# Patient Record
Sex: Male | Born: 1965 | Race: White | Hispanic: No | Marital: Married | Smoking: Never smoker
Health system: Southern US, Community
[De-identification: ages and names within clinical notes are randomized; demographics above are authoritative.]

## PROBLEM LIST (undated history)

## (undated) DIAGNOSIS — G4730 Sleep apnea, unspecified: Secondary | ICD-10-CM

## (undated) DIAGNOSIS — F32A Depression, unspecified: Secondary | ICD-10-CM

## (undated) DIAGNOSIS — I10 Essential (primary) hypertension: Secondary | ICD-10-CM

## (undated) DIAGNOSIS — E785 Hyperlipidemia, unspecified: Secondary | ICD-10-CM

## (undated) DIAGNOSIS — F909 Attention-deficit hyperactivity disorder, unspecified type: Secondary | ICD-10-CM

## (undated) DIAGNOSIS — F419 Anxiety disorder, unspecified: Secondary | ICD-10-CM

## (undated) HISTORY — DX: Attention-deficit hyperactivity disorder, unspecified type: F90.9

## (undated) HISTORY — DX: Depression, unspecified: F32.A

## (undated) HISTORY — DX: Sleep apnea, unspecified: G47.30

## (undated) HISTORY — DX: Hyperlipidemia, unspecified: E78.5

## (undated) HISTORY — DX: Essential (primary) hypertension: I10

## (undated) HISTORY — DX: Anxiety disorder, unspecified: F41.9

## (undated) HISTORY — PX: COLONOSCOPY W/ POLYPECTOMY: SHX1380

---

## 2004-09-15 ENCOUNTER — Ambulatory Visit: Payer: Self-pay | Admitting: Family Medicine

## 2005-04-09 ENCOUNTER — Ambulatory Visit: Payer: Self-pay | Admitting: Family Medicine

## 2005-04-22 ENCOUNTER — Ambulatory Visit: Payer: Self-pay | Admitting: Family Medicine

## 2008-04-26 ENCOUNTER — Emergency Department (HOSPITAL_COMMUNITY): Admission: EM | Admit: 2008-04-26 | Discharge: 2008-04-27 | Payer: Self-pay | Admitting: Emergency Medicine

## 2010-07-20 LAB — PROTIME-INR: Prothrombin Time: 14.3 seconds (ref 11.6–15.2)

## 2010-07-20 LAB — CBC
HCT: 45.9 % (ref 39.0–52.0)
Hemoglobin: 15.4 g/dL (ref 13.0–17.0)
MCV: 88.5 fL (ref 78.0–100.0)
WBC: 9.4 10*3/uL (ref 4.0–10.5)

## 2010-07-20 LAB — COMPREHENSIVE METABOLIC PANEL
Alkaline Phosphatase: 42 U/L (ref 39–117)
BUN: 25 mg/dL — ABNORMAL HIGH (ref 6–23)
CO2: 25 mEq/L (ref 19–32)
Chloride: 105 mEq/L (ref 96–112)
GFR calc non Af Amer: 35 mL/min — ABNORMAL LOW (ref >60)
Glucose, Bld: 88 mg/dL (ref 70–99)
Potassium: 4.5 mEq/L (ref 3.5–5.1)
Total Bilirubin: 1.1 mg/dL (ref 0.3–1.2)

## 2010-07-20 LAB — URINE MICROSCOPIC-ADD ON

## 2010-07-20 LAB — DIFFERENTIAL
Basophils Absolute: 0 10*3/uL (ref 0.0–0.1)
Basophils Relative: 1 % (ref 0–1)
Monocytes Absolute: 0.7 10*3/uL (ref 0.1–1.0)
Neutro Abs: 7.1 10*3/uL (ref 1.7–7.7)
Neutrophils Relative %: 75 % (ref 43–77)

## 2010-07-20 LAB — URINALYSIS, ROUTINE W REFLEX MICROSCOPIC
Bilirubin Urine: NEGATIVE
Leukocytes, UA: NEGATIVE
Nitrite: NEGATIVE
Specific Gravity, Urine: 1.014 (ref 1.005–1.030)
pH: 6 (ref 5.0–8.0)

## 2010-07-20 LAB — APTT: aPTT: 30 seconds (ref 24–37)

## 2010-07-20 LAB — POCT CARDIAC MARKERS
Troponin i, poc: <0.05 ng/mL (ref 0.00–0.09)
Troponin i, poc: <0.05 ng/mL (ref 0.00–0.09)

## 2020-11-19 ENCOUNTER — Encounter: Payer: Self-pay | Admitting: Neurology

## 2020-11-19 ENCOUNTER — Other Ambulatory Visit: Payer: Self-pay | Admitting: Neurology

## 2020-11-20 ENCOUNTER — Ambulatory Visit (INDEPENDENT_AMBULATORY_CARE_PROVIDER_SITE_OTHER): Payer: BC Managed Care – PPO | Admitting: Neurology

## 2020-11-20 ENCOUNTER — Encounter: Payer: Self-pay | Admitting: Neurology

## 2020-11-20 VITALS — BP 127/75 | HR 52 | Ht 71.0 in | Wt 233.0 lb

## 2020-11-20 DIAGNOSIS — F5112 Insufficient sleep syndrome: Secondary | ICD-10-CM | POA: Diagnosis not present

## 2020-11-20 DIAGNOSIS — G4719 Other hypersomnia: Secondary | ICD-10-CM

## 2020-11-20 DIAGNOSIS — K08109 Complete loss of teeth, unspecified cause, unspecified class: Secondary | ICD-10-CM | POA: Insufficient documentation

## 2020-11-20 DIAGNOSIS — G4733 Obstructive sleep apnea (adult) (pediatric): Secondary | ICD-10-CM | POA: Insufficient documentation

## 2020-11-20 DIAGNOSIS — R351 Nocturia: Secondary | ICD-10-CM | POA: Diagnosis not present

## 2020-11-20 NOTE — Progress Notes (Signed)
Kyle Carter    SLEEP MEDICINE CLINIC    Provider:  Melvyn Novas, MD   Primary Care Physician:  Nonnie Done., MD [address] [place] [address]     Referring Provider: Nonnie Done., Md [address] [place],  [place] [address]          Chief Complaint according to patient   Patient presents with:     New Patient (Initial Visit)           HISTORY OF PRESENT ILLNESS:  Kyle Carter is a 55 y.o. year old White or Caucasian male patient seen here as a referral on 11/20/2020 from PCP for a sleep consultation:. he patient has been using CPAPfor over almost decade, last sleep study in 09-2017, at Dr. Winfred Leeds , [facility].   and last new CPAP issued in the last year 2021.  The patient's wife reports him snoring through the CPAP.   The patient lives in [place] and he has had issues with snoring and residual apnea on his current CPAP.  Again the CPAP is a rather new model.  And I have some compliance download here 1 over the last 90 days which shows a 78% compliance.  The compliance has become more spotty as he is less happy with his CPAP use.  He is actually using at BiPAP with an incoming pressure of 18 and a expiratory pressure setting of 14 cmH2O is easy breeze function on.  The residual AHI is 26.6/h so what this basically means is that the machine is not working for him.  He has a severe residual apnea by using it.  Most of the events still seem to be obstructive in nature but there are also some central apneas arising.  The air leak at median is 16.2 L and that should not be the cause of the high residual apnea.  Minute ventilation and tidal volume have been in the same range for the last 90 days respiratory rate is not wearing very much. The patient presented with excessive daytime sleepiness reflected and an Epworth sleepiness score of 18 points. There was a report of a dry mouth in the morning witnessed lo ud snoring and witnessed apneas by his  spouse. The patient's regular sleep routine is as follows; the patient normally goes to bed around 11 and falls asleep within 10-15 minutes he is awoken 3 times at night and nocturia and is woken by his alarm clock at 6 in the morning. 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  This functional set up he also has increased daytime sleepiness and currently endorsed the Epworth sleepiness score at 17 points.  He uses a dream wear FFM, and has facial hair.  He has never used any other mask that was not a FFM, he has slept with his mouth wide open, dry , may ned a chin strap.        I have the pleasure of seeing Kyle Carter today, a right-handed White or Caucasian male with OSA untreated on BiPAP  sleep disorder who  has a past medical history of ADHD, Anxiety, Depression, Hyperlipidemia, Hypertension, and Sleep apnea.     Sleep relevant medical history: Nocturia- 4-5 ,  no cervical spine surgery,  deviated septum with left sided restricted airflow.     Family medical /sleep history: one brother on CPAP with OSA.    Social history:  Patient is working as Biomedical engineer-  lives with spouse and step son.many pets.  The patient currently works/ used to work in shifts(early,)  Tobacco use/.  ETOH use 2/ night ,  Caffeine intake in form of Tea ( 2 a day) or energy drinks. Regular exercise in form of physical work .      Sleep habits are as follows: The patient's dinner time is between 6-7 PM. The patient goes to bed at 8.30 PM and continues to sleep for 4-5 hours, wakes for many, many bathroom breaks.   The preferred sleep position is supine , with the support of 2 pillows.  Dreams are reportedly rare.  3.30  AM is the usual rise time. The patient wakes up with an alarm. He snoozes his alarm, is often late for work.  He reports not feeling refreshed or restored in AM, with symptoms such as dry mouth, morning headaches, and residual fatigue.  Naps are taken frequently, dozes off, Its enough  to sit still for 2-3 minutes.    Review of Systems: Out of a complete 14 system review, the patient complains of only the following symptoms, and all other reviewed systems are negative.:  Fatigue, sleepiness , snoring, fragmented sleep, NOCTURIA, untreated, insufficient Sleep apnea treatment,    How likely are you to doze in the following situations: 0 = not likely, 1 = slight chance, 2 = moderate chance, 3 = high chance   Sitting and Reading? Watching Television? Sitting inactive in a public place (theater or meeting)? As a passenger in a car for an hour without a break? Lying down in the afternoon when circumstances permit? Sitting and talking to someone? Sitting quietly after lunch without alcohol? In a car, while stopped for a few minutes in traffic?   Total = 18/ 24 points   FSS endorsed at 29/ 63 points.   Social History   Socioeconomic History   Marital status: Married    Spouse name: Not on file   Number of children: Not on file   Years of education: Not on file   Highest education level: Not on file  Occupational History   Not on file  Tobacco Use   Smoking status: Never   Smokeless tobacco: Never  Substance and Sexual Activity   Alcohol use: Yes    Comment: occasional   Drug use: Not Currently   Sexual activity: Not on file  Other Topics Concern   Not on file  Social History Narrative   Not on file   Social Determinants of Health   Financial Resource Strain: Not on file  Food Insecurity: Not on file  Transportation Needs: Not on file  Physical Activity: Not on file  Stress: Not on file  Social Connections: Not on file    Family History  Problem Relation Age of Onset   Diabetes Mother    Liver cancer Mother    Diabetes Father     Past Medical History:  Diagnosis Date   ADHD    Anxiety    Depression    Hyperlipidemia    Hypertension    Sleep apnea     Current Outpatient Medications on File Prior to Visit  Medication Sig Dispense Refill    amphetamine-dextroamphetamine (ADDERALL XR) 30 MG 24 hr capsule      atenolol (TENORMIN) 100 MG tablet      atorvastatin (LIPITOR) 10 MG tablet Take 10 mg by mouth daily.     busPIRone (BUSPAR) 10 MG tablet Take 10 mg by mouth 2 (two) times daily.  FLUoxetine (PROZAC) 40 MG capsule Take 40 mg by mouth 2 (two) times daily.     zolpidem (AMBIEN) 10 MG tablet Take 10 mg by mouth at bedtime.     No current facility-administered medications on file prior to visit.    Not on File  Physical exam:  Today's Vitals   11/20/20 1114  BP: 127/75  Pulse: (!) 52  Weight: 233 lb (105.7 kg)  Height: 5\' 11"  (1.803 m)   Body mass index is 32.5 kg/m.   Wt Readings from Last 3 Encounters:  11/20/20 233 lb (105.7 kg)     Ht Readings from Last 3 Encounters:  11/20/20 5\' 11"  (1.803 m)      General: The patient is awake, alert and appears not in acute distress. The patient is well groomed. Head: Normocephalic, atraumatic. Neck is supple. Mallampati 3,  neck circumference:18 inches . Nasal airflow barely- deviated, left side barely patent.  Retrognathia is not seen.  Dental status: edentulous Cardiovascular:  Regular rate and cardiac rhythm by pulse,  without distended neck veins. Respiratory: Lungs are clear to auscultation.  Skin:  Without evidence of ankle edema, or rash. Trunk: The patient's posture is erect.   Neurologic exam : The patient is awake and alert, oriented to place and time.   Memory subjective described as intact.  Attention span & concentration ability appears normal.  Speech is fluent,  without  dysarthria, dysphonia or aphasia.  Mood and affect are appropriate.   Cranial nerves: no loss of smell or taste reported  Pupils are equal and briskly reactive to light. Funduscopic exam deferred. .  Extraocular movements in vertical and horizontal planes were intact and without nystagmus. No Diplopia. Visual fields by finger perimetry are intact. Hearing was intact to soft  voice and finger rubbing.    Facial sensation intact to fine touch.  Facial motor strength is symmetric and tongue and uvula move midline.  Neck ROM : rotation, tilt and flexion extension were normal for age and shoulder shrug was symmetrical.    Motor exam:  Symmetric bulk, tone and ROM.   Normal tone without cog -wheeling, symmetric grip strength .   Sensory:  vibration were normal.  Proprioception tested in the upper extremities was normal.   Coordination: Rapid alternating movements in the fingers/hands were of normal speed.  The Finger-to-nose maneuver was intact without evidence of ataxia, dysmetria or tremor.   Gait and station: Patient could rise unassisted from a seated position, walked without assistive device.  Stance is of normal width/ base and the patient turned with 3 steps.  Toe and heel walk were deferred.  Deep tendon reflexes: in the  upper and lower extremities are symmetric and intact.  Babinski response was deferred .       After spending a total time of  45  minutes face to face and additional time for physical and neurologic examination, review of laboratory studies,  personal review of imaging studies, reports and results of other testing and review of referral information / records as far as provided in visit, I have established the following assessments:  Kyle Carter reports that he has been tested and refurbished with a CPAP first at Dr. office in [place].  Recently his machine was replaced without a new baseline study his last sleep study he stated was in June 2019 and there have not been any following.  Since his old CPAP is now replaced by a BiPAP he has noted some significant changes in his  sleep pattern  First overall he does not feel refreshed and restored anymore he wakes up with a very dry mouth and his Epworth score is very high excessive daytime sleepiness is present.  #2 his residual apnea-hypopnea index is so high that it is almost  untreated apnea.  AHI was 29.1 on BiPAP and she does not recall having the same difficulty sleeping when he was on CPAP.  Part of this may also be the air leak related to facial hair and using a F 30 I or DreamWear full facemask.  There is definitely a lesser seal but I do not think that his leakage is the sole explanation for his residual apneas.  They are obstructive in origin and that he seems to need higher pressures to overcome those.  So what I would like to address is to offer the patient a chinstrap especially since he is now edentulous and has less structure to support his mouth opening.  #2 I would very much prefer to have a new sleep study on him this is meant to see if he has actually central apneas and not just obstructive and apneas and what degree his current apnea is now that he has no teeth yet.   The CPAP titration from Dr. Rachael Darby was dated 09-11-2017 and he did not have significant oxygen desaturation during titration had bradycardia but no rhythm abnormalities, snoring was described as illuminated with nasal CPAP.  No leg movements were noted.  And he was titrated beginning at 5 cmH2O CPAP to a final pressure of BiPAP 18/14 in which he slept 90 out of 158 minutes.    The apnea-hypopnea index was 0.  I will asked the patient to come in for a split night titration in the meantime to hopefully bridge over I will reset his current sleep apnea machine to 20/16 cmH2O I believe that he has not enough pressure to overcome his apnea at this time but it may not be sufficient yet to eliminate all residual apnea.   My Plan is to proceed with:  1) I need a baseline and a PAP titration, currently not doing well on 18/ 14 cm water.  2) I reset to 10/ 16 cm water  3) I will order an elastic chin strap   I would like to thank Egbert Garibaldi, Excell Seltzer., MD and Nonnie Done., Md [address] [place],  [place] [address] for allowing me to meet with and to take care of this pleasant patient.   In short, Kyle Carter is presenting with insufficiently treated OSA ,I plan to follow up either personally or through our NP within 2-4  month.   CC: I will share my notes with PCP.he patient has been using CPAP  Electronically signed by: Melvyn Novas, MD 11/20/2020 11:34 AM  [name] Board certified by The [employer] of Sleep Medicine and Diplomate of the [employer] of Sleep Medicine. Board certified In Neurology through the ABPN, Fellow of the [employer] of Neurology. Medical Director of [facility].

## 2021-01-17 ENCOUNTER — Emergency Department (HOSPITAL_COMMUNITY): Payer: BC Managed Care – PPO

## 2021-01-17 ENCOUNTER — Other Ambulatory Visit: Payer: Self-pay

## 2021-01-17 ENCOUNTER — Encounter (HOSPITAL_COMMUNITY): Payer: Self-pay | Admitting: Emergency Medicine

## 2021-01-17 ENCOUNTER — Emergency Department (HOSPITAL_COMMUNITY)
Admission: EM | Admit: 2021-01-17 | Discharge: 2021-01-17 | Disposition: A | Discharge status: Home / Self Care | Payer: BC Managed Care – PPO | Attending: Emergency Medicine | Admitting: Emergency Medicine

## 2021-01-17 DIAGNOSIS — M545 Low back pain, unspecified: Secondary | ICD-10-CM | POA: Diagnosis not present

## 2021-01-17 DIAGNOSIS — I10 Essential (primary) hypertension: Secondary | ICD-10-CM | POA: Diagnosis not present

## 2021-01-17 DIAGNOSIS — N3001 Acute cystitis with hematuria: Secondary | ICD-10-CM | POA: Diagnosis not present

## 2021-01-17 DIAGNOSIS — R309 Painful micturition, unspecified: Secondary | ICD-10-CM | POA: Diagnosis present

## 2021-01-17 LAB — URINALYSIS, ROUTINE W REFLEX MICROSCOPIC
Bilirubin Urine: NEGATIVE
Glucose, UA: NEGATIVE mg/dL
Ketones, ur: NEGATIVE mg/dL
Nitrite: NEGATIVE
Protein, ur: NEGATIVE mg/dL
Specific Gravity, Urine: 1.02 (ref 1.005–1.030)
pH: 5 (ref 5.0–8.0)

## 2021-01-17 LAB — CBC
HCT: 41.8 % (ref 39.0–52.0)
Hemoglobin: 14.4 g/dL (ref 13.0–17.0)
MCH: 29.3 pg (ref 26.0–34.0)
MCHC: 34.4 g/dL (ref 30.0–36.0)
MCV: 85 fL (ref 80.0–100.0)
Platelets: 164 10*3/uL (ref 150–400)
RBC: 4.92 MIL/uL (ref 4.22–5.81)
RDW: 12.8 % (ref 11.5–15.5)
WBC: 6.2 10*3/uL (ref 4.0–10.5)
nRBC: 0 % (ref 0.0–0.2)

## 2021-01-17 LAB — COMPREHENSIVE METABOLIC PANEL
ALT: 18 U/L (ref 0–44)
AST: 13 U/L — ABNORMAL LOW (ref 15–41)
Albumin: 3.7 g/dL (ref 3.5–5.0)
Alkaline Phosphatase: 45 U/L (ref 38–126)
Anion gap: 9 (ref 5–15)
BUN: 29 mg/dL — ABNORMAL HIGH (ref 6–20)
CO2: 21 mmol/L — ABNORMAL LOW (ref 22–32)
Calcium: 8.3 mg/dL — ABNORMAL LOW (ref 8.9–10.3)
Chloride: 104 mmol/L (ref 98–111)
Creatinine, Ser: 1.05 mg/dL (ref 0.61–1.24)
GFR, Estimated: >60 mL/min (ref >60)
Glucose, Bld: 116 mg/dL — ABNORMAL HIGH (ref 70–99)
Potassium: 3.8 mmol/L (ref 3.5–5.1)
Sodium: 134 mmol/L — ABNORMAL LOW (ref 135–145)
Total Bilirubin: 1.7 mg/dL — ABNORMAL HIGH (ref 0.3–1.2)
Total Protein: 7 g/dL (ref 6.5–8.1)

## 2021-01-17 MED ORDER — SODIUM CHLORIDE 0.9 % IV SOLN
2.0000 g | Freq: Once | INTRAVENOUS | Status: AC
  Administered 2021-01-17: 2 g via INTRAVENOUS
  Filled 2021-01-17: qty 20

## 2021-01-17 MED ORDER — CEFDINIR 300 MG PO CAPS: 300.0000 mg | ORAL_CAPSULE | Freq: Two times a day (BID) | ORAL | 0 refills | Status: AC

## 2021-01-17 MED ORDER — SODIUM CHLORIDE 0.9 % IV BOLUS
1000.0000 mL | Freq: Once | INTRAVENOUS | Status: AC
  Administered 2021-01-17: 1000 mL via INTRAVENOUS

## 2021-01-17 NOTE — ED Triage Notes (Signed)
Pt was dx'd with a UTI 2 days ago and given levaquin. States that he had a fever of 103 at home tonight. Denies N/V. No hematuria.

## 2021-01-17 NOTE — Discharge Instructions (Addendum)
You were evaluated in the Emergency Department and after careful evaluation, we did not find any emergent condition requiring admission or further testing in the hospital.  Your exam/testing today was overall reassuring.  Please stop taking the Levaquin and start taking the [facility] antibiotic.  Please return to the Emergency Department if you experience any worsening of your condition.  Thank you for allowing [place] to be a part of your care.

## 2021-01-17 NOTE — ED Notes (Signed)
Patient transported to CT 

## 2021-01-17 NOTE — ED Provider Notes (Signed)
[facility]-EMERGENCY DEPT [facility] Emergency Department Provider Note MRN:  [id number]  Arrival date & time: 01/17/21     Chief Complaint   Fever   History of Present Illness   Kyle Carter is a 55 y.o. year-old male with no pertinent past medical history presenting to the ED with chief complaint of fever.  Patient has been having burning with urination and blood in the urine for the past 2 or 3 days, was seen by PCP and started on Levaquin.  Has taken 2 doses, once daily as directed.  Last dose yesterday at 5 PM.  Over the evening had a fever up to 102 at home.  Continued mild burning with urination.  Denies any significant abdominal pain.  Endorsing some mild mid low back pain.  No chest pain or shortness of breath, no fever, cough, cold-like symptoms.  No rash.  No other complaints.  Symptoms mild to moderate, constant, no exacerbating or alleviating factors.  Review of Systems  A complete 10 system review of systems was obtained and all systems are negative except as noted in the HPI and PMH.   Patient's Health History    Past Medical History:  Diagnosis Date   ADHD    Anxiety    Depression    Hyperlipidemia    Hypertension    Sleep apnea     Past Surgical History:  Procedure Laterality Date   COLONOSCOPY W/ POLYPECTOMY      Family History  Problem Relation Age of Onset   Diabetes Mother    Liver cancer Mother    Diabetes Father     Social History   Socioeconomic History   Marital status: Married    Spouse name: Not on file   Number of children: Not on file   Years of education: Not on file   Highest education level: Not on file  Occupational History   Not on file  Tobacco Use   Smoking status: Never   Smokeless tobacco: Never  Substance and Sexual Activity   Alcohol use: Yes    Comment: occasional   Drug use: Not Currently   Sexual activity: Not on file  Other Topics Concern   Not on file  Social History Narrative   Not on file   Social  Determinants of Health   Financial Resource Strain: Not on file  Food Insecurity: Not on file  Transportation Needs: Not on file  Physical Activity: Not on file  Stress: Not on file  Social Connections: Not on file  Intimate Partner Violence: Not on file     Physical Exam   Vitals:   01/17/21 0521 01/17/21 0620  BP: 112/72 117/60  Pulse: (!) 52 (!) 56  Resp: 17 18  Temp:    SpO2: 97% 98%    CONSTITUTIONAL: Well-appearing, NAD NEURO:  Alert and oriented x 3, no focal deficits EYES:  eyes equal and reactive ENT/NECK:  no LAD, no JVD CARDIO: Regular rate, well-perfused, normal S1 and S2 PULM:  CTAB no wheezing or rhonchi GI/GU:  normal bowel sounds, non-distended, non-tender MSK/SPINE:  No gross deformities, no edema SKIN:  no rash, atraumatic PSYCH:  Appropriate speech and behavior  *Additional and/or pertinent findings included in MDM below  Diagnostic and Interventional Summary    EKG Interpretation  Date/Time:    Ventricular Rate:    PR Interval:    QRS Duration:   QT Interval:    QTC Calculation:   R Axis:     Text Interpretation:  Labs Reviewed  URINALYSIS, ROUTINE W REFLEX MICROSCOPIC - Abnormal; Notable for the following components:      Result Value   Color, Urine AMBER (*)    Hgb urine dipstick SMALL (*)    Leukocytes,Ua TRACE (*)    Bacteria, UA RARE (*)    All other components within normal limits  COMPREHENSIVE METABOLIC PANEL - Abnormal; Notable for the following components:   Sodium 134 (*)    CO2 21 (*)    Glucose, Bld 116 (*)    BUN 29 (*)    Calcium 8.3 (*)    AST 13 (*)    Total Bilirubin 1.7 (*)    All other components within normal limits  URINE CULTURE  CBC    CT RENAL STONE STUDY  Final Result      Medications  cefTRIAXone (ROCEPHIN) 2 g in sodium chloride 0.9 % 100 mL IVPB (0 g Intravenous Stopped 01/17/21 0636)  sodium chloride 0.9 % bolus 1,000 mL (0 mLs Intravenous Stopped 01/17/21 0636)     Procedures  /   Critical Care Procedures  ED Course and Medical Decision Making  I have reviewed the triage vital signs, the nursing notes, and pertinent available records from the EMR.  Listed above are laboratory and imaging tests that I personally ordered, reviewed, and interpreted and then considered in my medical decision making (see below for details).  Suspect fever due to urinary tract infection not responding to Levaquin.  Vital signs reassuring here, nontoxic, benign abdomen.  Obtaining labs, providing IV fluids, ceftriaxone, will obtain CT scan to exclude signs of abscess or kidney stone that would explain failure antibiotics.  Continues to do well and work-up is reassuring, would be a candidate for discharge on a different antibiotic.     Work-up reassuring, appropriate for discharge.  Elmer Sow. Pilar Plate, MD [facility] Emergency Medicine [facility] [email or web address]  Final Clinical Impressions(s) / ED Diagnoses     ICD-10-CM   1. Acute cystitis with hematuria  N30.01       ED Discharge Orders          Ordered    cefdinir (OMNICEF) 300 MG capsule  2 times daily        01/17/21 0708             Discharge Instructions Discussed with and Provided to Patient:     Discharge Instructions      You were evaluated in the Emergency Department and after careful evaluation, we did not find any emergent condition requiring admission or further testing in the hospital.  Your exam/testing today was overall reassuring.  Please stop taking the Levaquin and start taking the [facility] antibiotic.  Please return to the Emergency Department if you experience any worsening of your condition.  Thank you for allowing [place] to be a part of your care.         Sabas Sous, MD 01/17/21 [phone]

## 2021-01-17 NOTE — ED Notes (Addendum)
, °

## 2021-01-17 NOTE — ED Notes (Signed)
Pt returned from CT °

## 2021-01-18 LAB — URINE CULTURE
Culture: NO GROWTH
Special Requests: NORMAL

## 2021-01-21 ENCOUNTER — Other Ambulatory Visit: Payer: Self-pay

## 2021-01-21 ENCOUNTER — Ambulatory Visit (INDEPENDENT_AMBULATORY_CARE_PROVIDER_SITE_OTHER): Payer: BC Managed Care – PPO | Admitting: Neurology

## 2021-01-21 DIAGNOSIS — R351 Nocturia: Secondary | ICD-10-CM

## 2021-01-21 DIAGNOSIS — G4733 Obstructive sleep apnea (adult) (pediatric): Secondary | ICD-10-CM

## 2021-01-21 DIAGNOSIS — G4719 Other hypersomnia: Secondary | ICD-10-CM

## 2021-01-21 DIAGNOSIS — F5112 Insufficient sleep syndrome: Secondary | ICD-10-CM

## 2021-01-21 DIAGNOSIS — K08109 Complete loss of teeth, unspecified cause, unspecified class: Secondary | ICD-10-CM

## 2021-01-27 NOTE — Addendum Note (Signed)
Addended by: Melvyn Novas on: 01/27/2021 06:15 PM   Modules accepted: Orders

## 2021-01-27 NOTE — Procedures (Signed)
PATIENT'S NAME:  Dacotah, Cabello DOB:      1966/02/26      MR#:    [id number]     DATE OF RECORDING: 01/21/2021 REFERRING M.D.:  Cheri Rous, MD Study Performed:   Titration to positive airway pressure  HISTORY:  RYANE CANAVAN , is a right-handed male with know OSA sleep disorder on BiPAP, who is edentulous an  has a medical history of ADHD, Anxiety, Depression, Hyperlipidemia, Hypertension. Sleep apnea was last evaluated by Dr. Eugenie Birks in [place], 09-2017, but his machine dates from 2021. He reports Nocturia, 4-5 times a night. EDS- severe sleepiness> He works for [employer], early shift schedule.   The patient has had issues with snoring and residual apnea on his current BiPAP.  Again, the PAP machine is a rather new model.  And I have some compliance download data; over the last 90 days which shows a 78% compliance.  The compliance has become more spotty as he is less happy with his BiPAP use.  He is actually using at BiPAP with an incoming pressure of 18 and  expiratory pressure setting of 14 cmH2O with " easy breeze function" on. The residual AHI is 26.6/h so what this basically means is that the machine is not working for him.  He has a severe residual apnea by using it.   Most of the events still seem to be obstructive in nature but there are also some central apneas.  The air leak at median is 16.2 L and that should not be the cause of the high residual apnea.   Minute ventilation and tidal volume have been in the same range for the last 90 days.   The patient endorsed the Epworth Sleepiness Scale at 18/24 points and the Fatigue Score 29 points.   The patient's weight 233 pounds with a height of 71 (inches), resulting in a BMI of 32.7 kg/m2. The patient's neck circumference measured 18 inches.  CURRENT MEDICATIONS: Adderall XR, Tenormin, Lipitor, Buspar, Prozac, Ambien    PROCEDURE:  This is a multichannel digital polysomnogram utilizing the SomnoStar 11.2 system.  Electrodes and sensors  were applied and monitored per AASM Specifications.   EEG, EOG, Chin and Limb EMG, were sampled at 200 Hz.  ECG, Snore and Nasal Pressure, Thermal Airflow, Respiratory Effort, CPAP Flow and Pressure, Oximetry was sampled at 50 Hz. Digital video and audio were recorded.       Simplus FFM in size small/medium PAP was initiated at 10/6 cmH20 BiPAP and with heated humidity per AASM standards this pressure was advanced to 25 /21cmH20 because of hypopneas, apneas and desaturations. CPAP was not tried. 4 cm pressure spread was maintained all night.  No improvement of sleep apnea was noted, the patient had severe air-leaks and used a chin strap, will need to use dentures to support oral structure and mask fit (tried another mask, EVORA, which failed to seal better)   Lights Out was at 21:49 and Lights On at 05:07. Total recording time (TRT) was 437 minutes, with a total sleep time (TST) of 387 minutes.  The patient's sleep latency was 6.5 minutes.  REM latency was 36.5 minutes. The sleep efficiency was 88.6 %.    SLEEP ARCHITECTURE: WASO (Wake after sleep onset) was 43.5 minutes.  There were 61 minutes in Stage N1, 203.5 minutes Stage N2, 4.5 minutes Stage N3 and 118 minutes in Stage REM.  The percentage of Stage N1 was 15.8%, Stage N2 was 52.6%, Stage N3 was 1.2% and Stage  R (REM sleep) was 30.5%. The sleep architecture was notable for severe fragmentation, early REM sleep onset.  RESPIRATORY ANALYSIS:  There was a total of 190 respiratory events: 40 obstructive apneas, 0 central apneas and 0 mixed apneas with a total of 40 apneas and an apnea index (AI) of 6.2 /hour. There were 150 hypopneas with a hypopnea index of 23.3/hour.  The total APNEA/HYPOPNEA INDEX  (AHI) was 29.5 /hour and the total RESPIRATORY DISTURBANCE INDEX was 29.5 /hour  78 events occurred in REM sleep and 112 events in NREM. The REM AHI was 39.7 /hour versus a non-REM AHI of 25. /hour.   The patient slept all 387 minutes of total sleep  time in the supine position.  OXYGEN SATURATION & C02:  The baseline 02 saturation was 97%, with the lowest being 74%. Time spent below 89% saturation equaled 98 minutes. PERIODIC LIMB MOVEMENTS:  The patient had a total of 44 Periodic Limb Movements. The Periodic Limb Movement (PLM Arousal index was 0 /hour. The arousals were noted as: 59 were spontaneous, 0 were associated with PLMs, 77 were associated with respiratory events. The patient took bathroom breaks. Snoring was noted. EKG was in keeping with normal sinus rhythm (NSR). The patient was fitted with a Simplus FFM mask, and a chin strap was used.  DIAGNOSIS Moderate -severe Obstructive Sleep Apnea, not responding to BiPAP at various pressures, always using a spread of 4 cm water. Even the highest setting at 25/21 cm water BiPAP did leave the patient with an AHI of 22.9/h, best result was seen a setting of 21/17 cm BiPAP over 65 minutes with an AHI of 11.9/h.   Extremely fragmented sleep.  Sleep Related Hypoxemia, frequent and brief desaturations,    PLANS/RECOMMENDATIONS: The patient was fitted with a Simplus FFM mask, BiPAP will be used at 21/16 cm water  The patient was on BIPAP before and felt he couldn't tolerate the device. One problem is the high air leakage- he needs dentures to support facial structure under the mask. Chin strap alone did not help. His BiPAP should be reset in the waiting time for this test to 21/16 cm water. Once dentures are present, will retry BiPAP with various spread ( 4 , 5 , 6 cm difference Inspiratory versus Expiratory pressure.          DISCUSSION: A follow up appointment with BiPAP and recent downloads will be scheduled in the Sleep Clinic at [facility].   Please arrange for dental visit first -  call [phone] with any questions.      I certify that I have reviewed the entire raw data recording prior to the issuance of this report in accordance with the Standards of  Accreditation of the American Academy of Sleep Medicine (AASM)      Melvyn Novas, M.D. Diplomat, Biomedical engineer of Psychiatry and Neurology  Diplomat, Biomedical engineer of Sleep Medicine Wellsite geologist, [employer] [employer]

## 2021-01-27 NOTE — Progress Notes (Signed)
All night BiPAP titration with 4 cm spread.  Edentulous patient . All night supine sleep.  No resolution of apnea found, only high air leak in spite of FFM and chin strap. Hypoxia was noted, nadir at 74%.  Did best under 21/17 cm water, with AHI 11.9. and nearly all residual events were hypopneas.   The patient was fitted with a Simplus FFM mask, BiPAP will be used at 21/16 cm water  The patient was on BIPAP before and felt he couldn't tolerate the device. One problem is the high air leakage- he needs dentures to support facial structure under the mask. Chin strap alone did not help. His BiPAP should be reset in the waiting time for this test to 21/16 cm water. Once dentures are present, will retry CPAP/ and if needed BiPAP with various spread ( 4 , 5 , 6 cm difference Inspiratory versus Expiratory pressure).

## 2021-01-29 ENCOUNTER — Telehealth: Payer: Self-pay | Admitting: Neurology

## 2021-01-29 ENCOUNTER — Other Ambulatory Visit: Payer: Self-pay | Admitting: Neurology

## 2021-01-29 DIAGNOSIS — G4733 Obstructive sleep apnea (adult) (pediatric): Secondary | ICD-10-CM

## 2021-01-29 DIAGNOSIS — G4719 Other hypersomnia: Secondary | ICD-10-CM

## 2021-01-29 NOTE — Telephone Encounter (Signed)
-----   Message from Melvyn Novas, MD sent at 01/27/2021  6:15 PM EDT ----- All night BiPAP titration with 4 cm spread.  Edentulous patient . All night supine sleep.  No resolution of apnea found, only high air leak in spite of FFM and chin strap. Hypoxia was noted, nadir at 74%.  Did best under 21/17 cm water, with AHI 11.9. and nearly all residual events were hypopneas.   The patient was fitted with a Simplus FFM mask, BiPAP will be used at 21/16 cm water  The patient was on BIPAP before and felt he couldn't tolerate the device. One problem is the high air leakage- he needs dentures to support facial structure under the mask. Chin strap alone did not help. His BiPAP should be reset in the waiting time for this test to 21/16 cm water. Once dentures are present, will retry CPAP/ and if needed BiPAP with various spread ( 4 , 5 , 6 cm difference Inspiratory versus Expiratory pressure).

## 2021-01-29 NOTE — Telephone Encounter (Signed)
Called the patient to review his sleep study results.  The patient was started on a pressure was BiPAP and throughout the night made adjustments.  At a pressure of 21/17 centimeters of water pressure that is where his apnea was best treated.  Dr. Vickey Huger recommends adjusting the pressure to that range and making sure the patient is scheduled for the mask that was used in the sleep lab.  Advised the patient the order being sent to [facility] for them to make that adjustment.  Patient verbalized understanding.  Patient was advised to also schedule an appointment and to call if there is any issues or concerns in the meantime.

## 2021-02-05 NOTE — Telephone Encounter (Signed)
Pt called, need [employer] to file a claim for mask for CPAP machine. When claim is denied, we will to file a appeal. Have already discussed with [employer]. Would like a call back  Best number to contact me.[phone]

## 2021-02-11 ENCOUNTER — Other Ambulatory Visit: Payer: Self-pay | Admitting: Neurology

## 2021-02-11 DIAGNOSIS — G4719 Other hypersomnia: Secondary | ICD-10-CM

## 2021-02-11 DIAGNOSIS — F5112 Insufficient sleep syndrome: Secondary | ICD-10-CM

## 2021-02-11 DIAGNOSIS — G4733 Obstructive sleep apnea (adult) (pediatric): Secondary | ICD-10-CM

## 2021-02-11 NOTE — Telephone Encounter (Signed)
Pt's wife, Jermall Isaacson (on [place]) called, mask he has from previous does not work. Leaking air and gasping for air, not breathing and have to wake up. Sleep study he was fitted with a different mask. [facility] cannot get a mask until December and head gear until March 2023. Need PA done for a mask and head gear. If denied we will do a medical necessity appeal. Would like a call from the nurse

## 2021-02-11 NOTE — Telephone Encounter (Signed)
Our office does not have anything to do with insurance claims related to mask, supplies, CPAP/BiPAP.  This is all done by [facility] or the DME company.  Called [facility] and was on the phone on hold for 25 minutes and once I finally got a representative and explained everything he advised that they had everything from their end and that nothing else was needed from [place].  He advised he would have someone from their center contact the patient. After hanging up with [facility] I contacted the patient's wife.  Advised that our office has nothing to do with CPAP supplies, insurance related to the machine and she verbalized understanding that information.  She works for [employer] and states that usually if something is needing to be overrided, a authorization is needed. I advised that this would still come from the DME company.  Alternatively I advised that we could transfer him to a different DME company as long as we have everything needed.  She agreed to this plan. [facility] is who I will try and transfer the pt to

## 2021-10-12 ENCOUNTER — Other Ambulatory Visit: Payer: Self-pay

## 2021-10-12 ENCOUNTER — Emergency Department (EMERGENCY_DEPARTMENT_HOSPITAL)
Admission: EM | Admit: 2021-10-12 | Discharge: 2021-10-13 | Disposition: A | Discharge status: Other Acute Inpatient Hospital | Payer: BC Managed Care – PPO | Source: Home / Self Care | Attending: Emergency Medicine | Admitting: Emergency Medicine

## 2021-10-12 ENCOUNTER — Encounter (HOSPITAL_COMMUNITY): Payer: Self-pay

## 2021-10-12 DIAGNOSIS — Z9189 Other specified personal risk factors, not elsewhere classified: Secondary | ICD-10-CM

## 2021-10-12 DIAGNOSIS — Z79899 Other long term (current) drug therapy: Secondary | ICD-10-CM | POA: Insufficient documentation

## 2021-10-12 DIAGNOSIS — R4589 Other symptoms and signs involving emotional state: Secondary | ICD-10-CM

## 2021-10-12 DIAGNOSIS — F332 Major depressive disorder, recurrent severe without psychotic features: Secondary | ICD-10-CM

## 2021-10-12 DIAGNOSIS — Z20822 Contact with and (suspected) exposure to covid-19: Secondary | ICD-10-CM | POA: Insufficient documentation

## 2021-10-12 DIAGNOSIS — F411 Generalized anxiety disorder: Secondary | ICD-10-CM | POA: Insufficient documentation

## 2021-10-12 DIAGNOSIS — R45851 Suicidal ideations: Secondary | ICD-10-CM

## 2021-10-12 LAB — COMPREHENSIVE METABOLIC PANEL
ALT: 24 U/L (ref 0–44)
AST: 20 U/L (ref 15–41)
Albumin: 4.1 g/dL (ref 3.5–5.0)
Alkaline Phosphatase: 41 U/L (ref 38–126)
Anion gap: 11 (ref 5–15)
BUN: 12 mg/dL (ref 6–20)
CO2: 21 mmol/L — ABNORMAL LOW (ref 22–32)
Calcium: 9.1 mg/dL (ref 8.9–10.3)
Chloride: 106 mmol/L (ref 98–111)
Creatinine, Ser: 1 mg/dL (ref 0.61–1.24)
GFR, Estimated: >60 mL/min (ref >60)
Glucose, Bld: 99 mg/dL (ref 70–99)
Potassium: 3.9 mmol/L (ref 3.5–5.1)
Sodium: 138 mmol/L (ref 135–145)
Total Bilirubin: 1.3 mg/dL — ABNORMAL HIGH (ref 0.3–1.2)
Total Protein: 6.9 g/dL (ref 6.5–8.1)

## 2021-10-12 LAB — CBC
HCT: 45.5 % (ref 39.0–52.0)
Hemoglobin: 15.1 g/dL (ref 13.0–17.0)
MCH: 29.3 pg (ref 26.0–34.0)
MCHC: 33.2 g/dL (ref 30.0–36.0)
MCV: 88.2 fL (ref 80.0–100.0)
Platelets: 240 10*3/uL (ref 150–400)
RBC: 5.16 MIL/uL (ref 4.22–5.81)
RDW: 12.9 % (ref 11.5–15.5)
WBC: 6.6 10*3/uL (ref 4.0–10.5)
nRBC: 0 % (ref 0.0–0.2)

## 2021-10-12 LAB — RAPID URINE DRUG SCREEN, HOSP PERFORMED
Amphetamines: NOT DETECTED
Barbiturates: NOT DETECTED
Benzodiazepines: NOT DETECTED
Cocaine: NOT DETECTED
Opiates: NOT DETECTED
Tetrahydrocannabinol: NOT DETECTED

## 2021-10-12 LAB — ACETAMINOPHEN LEVEL: Acetaminophen (Tylenol), Serum: <10 ug/mL — ABNORMAL LOW (ref 10–30)

## 2021-10-12 LAB — SALICYLATE LEVEL: Salicylate Lvl: <7 mg/dL — ABNORMAL LOW (ref 7.0–30.0)

## 2021-10-12 LAB — ETHANOL: Alcohol, Ethyl (B): <10 mg/dL (ref <10)

## 2021-10-12 NOTE — ED Notes (Signed)
TTS in process 

## 2021-10-12 NOTE — ED Provider Triage Note (Signed)
Emergency Medicine Provider Triage Evaluation Note  Kyle Carter , a 56 y.o. male  was evaluated in triage.  Pt complains of suicidal ideation. He reports that recently he has been having a hard time wife.  He states he has done things that he is not proud of.  They were arguing throughout the day and he got to the point where he picked with the intent that she will himself.  He states that he thought about due to his wife, he did not shoot himself.  He denies any thoughts about harming his wife.  He denies AVH.  He states that he does drink about 3 days a week and if he has more than a few drinks he has to get irritable.  He denies any substance use. He denies any history of self-harm.  He reports that he strained relationship with his two children noting they didn't wish him happy fathers day.   HE REPORTS HE HAS ACCESS TO MULTIPLE GUNS IN THE HOME.    Physical Exam  BP 137/83 (BP Location: Right Arm)   Pulse (!) 53   Temp 98.8 F (37.1 C) (Oral)   Resp 15   Ht 5\' 11"  (1.803 m)   Wt 108 kg   SpO2 97%   BMI 33.19 kg/m  Gen:   Awake, no distress   Resp:  Normal effort  MSK:   Moves extremities without difficulty  Other:  Patient is tearful, flat affect.  Medical Decision Making  Medically screening exam initiated at 8:26 PM.  Appropriate orders placed.  Dobbs was informed that the remainder of the evaluation will be completed by another provider, this initial triage assessment does not replace that evaluation, and the importance of remaining in the ED until their evaluation is complete.  Patient is currently here voluntarily.  He admits that he needs help stating he did not realize things were this bad. Given that he has access to firearms and went to the point of picking 1 up to attempt to kill himself today if he attempts to leave I would strongly recommend IVC.   Barbera Setters, Cristina Gong 10/12/21 2028
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
## 2021-10-12 NOTE — ED Notes (Signed)
Pt placed into burgandy scrubs.

## 2021-10-12 NOTE — ED Triage Notes (Signed)
Pt reports he has having marital problems and grabbed one of his guns today and thought about shooting himself. He changed his mind when he thought about his wife. Denies HI/AVH/drug/alcohol abuse.

## 2021-10-12 NOTE — ED Provider Notes (Signed)
[facility] EMERGENCY DEPARTMENT Provider Note   CSN: [id number] Arrival date & time: 10/12/21  8841     History  Chief Complaint  Patient presents with   Suicidal    Kyle Carter is a 57 y.o. male who presents today for evaluation of suicidal nation.  He reports that he has had issues with his wife recently.  They have been arguing.  Today things reached a boiling point where he picked up a gun and states he was going to shoot him self.  When I ask him what stopped him he tells me he though about what it would do to his wife.  He states he has done things he isn't proud of.  He reports that he has MULTIPLE GUNS IN THE HOME AND THAT BOTH HE AND HIS WIFE OWN GUNS.  He denies history of SI or HI.  He has no prior psychiatric admissions per his report.    He denies substance use, does have alcohol about three times a week.    He reports stressors including that his kids didn't wish him happy fathers day.  He denies any ingestions today.    He denies any physical complaints or concerns.    He reports that his wife called the police and he agreed to get help.   He is currently voluntary.   HPI     Home Medications Prior to Admission medications   Medication Sig Start Date End Date Taking? Authorizing Provider  amLODipine (NORVASC) 5 MG tablet Take 5 mg by mouth daily. 12/03/20   [provider]  amphetamine-dextroamphetamine (ADDERALL) 30 MG tablet Take 30 tablets by mouth 2 (two) times daily. 12/26/20   [provider]  atenolol (TENORMIN) 100 MG tablet Take 100 mg by mouth daily.    [provider]  atorvastatin (LIPITOR) 10 MG tablet Take 10 mg by mouth daily.    [provider]  busPIRone (BUSPAR) 10 MG tablet Take 10 mg by mouth 2 (two) times daily.    [provider]  FLUoxetine (PROZAC) 40 MG capsule Take 40 mg by mouth daily.    [provider]  levofloxacin (LEVAQUIN) 500 MG tablet Take 500 mg by  mouth daily. For 10 days 01/15/21   [provider]  zolpidem (AMBIEN) 10 MG tablet Take 10 mg by mouth at bedtime as needed for sleep.    [provider]      Allergies    Patient has no known allergies.     Physical Exam Updated Vital Signs BP 137/83 (BP Location: Right Arm)   Pulse (!) 53   Temp 98.8 F (37.1 C) (Oral)   Resp 15   Ht 5\' 11"  (1.803 m)   Wt 108 kg   SpO2 97%   BMI 33.19 kg/m  Physical Exam Vitals and nursing note reviewed.  Constitutional:      General: He is not in acute distress.    Appearance: He is not diaphoretic.  HENT:     Head: Normocephalic and atraumatic.  Eyes:     General: No scleral icterus.       Right eye: No discharge.        Left eye: No discharge.     Conjunctiva/sclera: Conjunctivae normal.  Cardiovascular:     Rate and Rhythm: Normal rate and regular rhythm.  Pulmonary:     Effort: Pulmonary effort is normal. No respiratory distress.     Breath sounds: No stridor.  Abdominal:  General: There is no distension.  Musculoskeletal:        General: No deformity.     Cervical back: Normal range of motion.  Skin:    General: Skin is warm and dry.  Neurological:     General: No focal deficit present.     Mental Status: He is alert.     Motor: No abnormal muscle tone.  Psychiatric:        Attention and Perception: Attention normal.        Mood and Affect: Mood is depressed. Affect is blunt and tearful.        Speech: Speech normal.        Behavior: Behavior is cooperative.        Thought Content: Thought content includes suicidal ideation. Thought content does not include homicidal ideation. Thought content includes suicidal plan. Thought content does not include homicidal plan.        Judgment: Judgment is impulsive.     Comments: Doesn't appear to respond to internal stimuli.      ED Results / Procedures / Treatments   Labs (all labs ordered are listed, but only abnormal results are displayed) Labs  Reviewed  COMPREHENSIVE METABOLIC PANEL - Abnormal; Notable for the following components:      Result Value   CO2 21 (*)    Total Bilirubin 1.3 (*)    All other components within normal limits  SALICYLATE LEVEL - Abnormal; Notable for the following components:   Salicylate Lvl <7.0 (*)    All other components within normal limits  ACETAMINOPHEN LEVEL - Abnormal; Notable for the following components:   Acetaminophen (Tylenol), Serum <10 (*)    All other components within normal limits  RESP PANEL BY RT-PCR (FLU A&B, COVID) ARPGX2  ETHANOL  CBC  RAPID URINE DRUG SCREEN, HOSP PERFORMED    EKG None  Radiology No results found.  Procedures Procedures    Medications Ordered in ED Medications - No data to display  ED Course/ Medical Decision Making/ A&P                           Medical Decision Making Amount and/or Complexity of Data Reviewed Labs: ordered.   This patient presents to the ED for concern of suicidal gesture.  He had picked up a gun with a plan to shoot himself today.  He has access to multiple firearms in the home including pistols and shotguns per his report. I attempted to review outside records and previous notes, I saw that he has a history of OSA treated with BiPAP. No significant psychiatric history noted other than reports of anxiety and depression and ADHD. Currently patient is voluntary. Given that he has access to firearms and went as far as picking him up today to attempt to himself with marital conflicts I am concerned that he is a very high risk for suicide attempt with adequate means.  He is currently voluntary admits that he needs help.   Labs are obtained and reviewed. Acetaminophen and salicylate levels are undetected.  Ethanol is undetected.  CBC and CMP are unremarkable his T. bili is minimally elevated, however this appears to be improved from his prior I do not think this is clinically significant.  UDS is negative.  Baseline EKG is  ordered.  COVID test is ordered per protocol.  At this time patient is medically clear for psychiatric evaluation and disposition. While he is currently voluntary if he does  attempt to leave he would require IVC.  I did discuss this with the patient who states his understanding and confirms that he wants to be here to get help.       Final Clinical Impression(s) / ED Diagnoses Final diagnoses:  Suicidal behavior without attempted self-injury  Has access to firearm    Rx / DC Orders ED Discharge Orders     None         Norman Clay 10/12/21 2128    Gerhard Munch, MD 10/12/21 2309

## 2021-10-13 ENCOUNTER — Encounter (HOSPITAL_COMMUNITY): Payer: Self-pay | Admitting: Nurse Practitioner

## 2021-10-13 ENCOUNTER — Inpatient Hospital Stay (HOSPITAL_COMMUNITY)
Admission: AD | Admit: 2021-10-13 | Discharge: 2021-10-18 | DRG: 885 | Disposition: A | Discharge status: Home / Self Care | Payer: BC Managed Care – PPO | Source: Intra-hospital | Attending: Psychiatry | Admitting: Psychiatry

## 2021-10-13 ENCOUNTER — Other Ambulatory Visit: Payer: Self-pay

## 2021-10-13 DIAGNOSIS — F411 Generalized anxiety disorder: Secondary | ICD-10-CM

## 2021-10-13 DIAGNOSIS — Z79899 Other long term (current) drug therapy: Secondary | ICD-10-CM | POA: Diagnosis not present

## 2021-10-13 DIAGNOSIS — Z20822 Contact with and (suspected) exposure to covid-19: Secondary | ICD-10-CM | POA: Diagnosis present

## 2021-10-13 DIAGNOSIS — I10 Essential (primary) hypertension: Secondary | ICD-10-CM | POA: Diagnosis present

## 2021-10-13 DIAGNOSIS — F329 Major depressive disorder, single episode, unspecified: Secondary | ICD-10-CM | POA: Diagnosis present

## 2021-10-13 DIAGNOSIS — R45851 Suicidal ideations: Secondary | ICD-10-CM

## 2021-10-13 DIAGNOSIS — G4733 Obstructive sleep apnea (adult) (pediatric): Secondary | ICD-10-CM | POA: Diagnosis present

## 2021-10-13 DIAGNOSIS — F332 Major depressive disorder, recurrent severe without psychotic features: Principal | ICD-10-CM | POA: Diagnosis present

## 2021-10-13 DIAGNOSIS — E785 Hyperlipidemia, unspecified: Secondary | ICD-10-CM | POA: Diagnosis present

## 2021-10-13 DIAGNOSIS — F909 Attention-deficit hyperactivity disorder, unspecified type: Secondary | ICD-10-CM | POA: Diagnosis present

## 2021-10-13 LAB — RESP PANEL BY RT-PCR (FLU A&B, COVID) ARPGX2
Influenza A by PCR: NEGATIVE
Influenza B by PCR: NEGATIVE
SARS Coronavirus 2 by RT PCR: NEGATIVE

## 2021-10-13 MED ORDER — ZOLPIDEM TARTRATE 5 MG PO TABS
10.0000 mg | ORAL_TABLET | Freq: Every evening | ORAL | Status: DC | PRN
  Administered 2021-10-13 – 2021-10-17 (×5): 10 mg via ORAL
  Filled 2021-10-13 (×5): qty 2

## 2021-10-13 MED ORDER — ZOLPIDEM TARTRATE 5 MG PO TABS
10.0000 mg | ORAL_TABLET | Freq: Every evening | ORAL | Status: DC | PRN
Start: 2021-10-13 — End: 2021-10-13

## 2021-10-13 MED ORDER — ACETAMINOPHEN 325 MG PO TABS: 650.0000 mg | ORAL_TABLET | Freq: Four times a day (QID) | ORAL | Status: DC | PRN

## 2021-10-13 MED ORDER — BUSPIRONE HCL 10 MG PO TABS
10.0000 mg | ORAL_TABLET | Freq: Two times a day (BID) | ORAL | Status: DC
  Administered 2021-10-13: 10 mg via ORAL
  Filled 2021-10-13: qty 1

## 2021-10-13 MED ORDER — ATORVASTATIN CALCIUM 10 MG PO TABS
10.0000 mg | ORAL_TABLET | Freq: Every morning | ORAL | Status: DC
  Administered 2021-10-13: 10 mg via ORAL
  Filled 2021-10-13: qty 1

## 2021-10-13 MED ORDER — MAGNESIUM HYDROXIDE 400 MG/5ML PO SUSP: 30.0000 mL | Freq: Every day | ORAL | Status: DC | PRN

## 2021-10-13 MED ORDER — ALUM & MAG HYDROXIDE-SIMETH 200-200-20 MG/5ML PO SUSP: 30.0000 mL | ORAL | Status: DC | PRN

## 2021-10-13 MED ORDER — FLUOXETINE HCL 20 MG PO CAPS
40.0000 mg | ORAL_CAPSULE | Freq: Every morning | ORAL | Status: DC
  Administered 2021-10-13: 40 mg via ORAL
  Filled 2021-10-13: qty 2

## 2021-10-13 MED ORDER — AMPHETAMINE-DEXTROAMPHETAMINE 10 MG PO TABS: 30.0000 mg | ORAL_TABLET | Freq: Every day | ORAL | Status: DC | PRN

## 2021-10-13 MED ORDER — AMLODIPINE BESYLATE 5 MG PO TABS
5.0000 mg | ORAL_TABLET | Freq: Every morning | ORAL | Status: DC
  Administered 2021-10-13: 5 mg via ORAL
  Filled 2021-10-13: qty 1

## 2021-10-13 MED ORDER — AMLODIPINE BESYLATE 5 MG PO TABS
5.0000 mg | ORAL_TABLET | Freq: Every day | ORAL | Status: DC
  Administered 2021-10-15 – 2021-10-18 (×4): 5 mg via ORAL
  Filled 2021-10-13 (×7): qty 1

## 2021-10-13 MED ORDER — ATENOLOL 100 MG PO TABS
100.0000 mg | ORAL_TABLET | Freq: Every morning | ORAL | Status: DC
  Administered 2021-10-14 – 2021-10-18 (×5): 100 mg via ORAL
  Filled 2021-10-13 (×9): qty 1

## 2021-10-13 MED ORDER — FLUOXETINE HCL 20 MG PO CAPS
40.0000 mg | ORAL_CAPSULE | Freq: Every morning | ORAL | Status: DC
  Administered 2021-10-14: 40 mg via ORAL
  Filled 2021-10-13 (×3): qty 2

## 2021-10-13 MED ORDER — ATORVASTATIN CALCIUM 10 MG PO TABS
10.0000 mg | ORAL_TABLET | Freq: Every morning | ORAL | Status: DC
  Administered 2021-10-14 – 2021-10-18 (×5): 10 mg via ORAL
  Filled 2021-10-13 (×8): qty 1

## 2021-10-13 MED ORDER — ATENOLOL 25 MG PO TABS
100.0000 mg | ORAL_TABLET | Freq: Every morning | ORAL | Status: DC
  Filled 2021-10-13 (×2): qty 4

## 2021-10-13 MED ORDER — BUSPIRONE HCL 10 MG PO TABS
10.0000 mg | ORAL_TABLET | Freq: Two times a day (BID) | ORAL | Status: DC
  Administered 2021-10-14 – 2021-10-18 (×9): 10 mg via ORAL
  Filled 2021-10-13 (×14): qty 1

## 2021-10-13 NOTE — Progress Notes (Signed)
Pt was accept to [facility] 10/13/2021 at 1900; Bed Assignment 403-1  Pt meets inpatient criteria per Carrie Mew, NP  Attending Physician will be Dr. Sherron Flemings  Report can be called to:Adult unit: [phone]  Report can by call at 1930 and pt can arrive after.  Nursing notified: Nursing notified: April Oakley, RN, and [facility] Joslyn Devon, RN   [place], [place] 10/13/2021 @ 9:37 AM

## 2021-10-13 NOTE — Progress Notes (Signed)
This is 1st [facility] inpt admission for this 56yo male, voluntarily admitted from [facility]. Pt reports SI thoughts to shoot self in head with a gun. Pt reports main stressor is marital issues with his wife, and he lied to her about looking at stuff on the internet. Pt states that he has not been treating her like he should, and she told him that she was done with him. Pt reports that he texted her that he loved her and she would find him "dead in the yard when she got off work." Pt's wife was calling/messaging him at this time, and his mother-in-law came to the house before 911 arrived. Pt reports he drinks around 4-12 ounce beers 2-3 times a week at times. Pt lives with wife and 27yo Kyle Carter, works at [employer] early morning. Pt reports using a CPAP machine at night. Pt denies SI in past. Currently prescribed prozac, buspar and ambien as needed. Pt denies SI/HI or hallucinations on admission (a) 15 min checks (r) safety maintained.

## 2021-10-13 NOTE — ED Provider Notes (Signed)
Emergency Medicine Observation Re-evaluation Note  Kyle Carter is a 56 y.o. male, seen on rounds today.  Pt initially presented to the ED for complaints of Suicidal and Depression Currently, the patient is feeling better.  Physical Exam  BP (!) 143/65 (BP Location: Left Arm)   Pulse (!) 53   Temp 97.6 F (36.4 C) (Oral)   Resp 16   Ht 5\' 11"  (1.803 m)   Wt 108 kg   SpO2 97%   BMI 33.19 kg/m  Physical Exam General: awake and alert Cardiac: rrr Lungs: cta b Psych: calm  ED Course / MDM  EKG:   I have reviewed the labs performed to date as well as medications administered while in observation.  Recent changes in the last 24 hours include pt has been accepted to [facility] this afternoon.  No problems o/n.  Plan  Current plan is for [facility] this afternoon.  Kyle Carter is not under involuntary commitment.     [facility], MD 10/13/21 [phone]

## 2021-10-13 NOTE — ED Notes (Signed)
Breakfast order placed ?

## 2021-10-13 NOTE — ED Notes (Signed)
Report called and given to Frederico Hamman, [facility] RN. All questions answered.

## 2021-10-13 NOTE — ED Notes (Signed)
Attempted report to [facility] x1.  

## 2021-10-13 NOTE — BH Assessment (Signed)
AC Fransico Michael, RN, has accepted pt at [facility] on 10/13/21 after 1500 pending a negative COVID test.  Room: 403-1 Accepting: Carrie Mew, NP Attending: Dr. Sherron Flemings Call to Report: [phone]  This information was relayed to pt's team at 0133.

## 2021-10-13 NOTE — Progress Notes (Signed)
Patient ID: Kyle Carter, male   DOB: 1966/03/01, 56 y.o.   MRN: [id number] This patient will need to come on night shift after 1900. If there is any question, please call Trevone Prestwood/AC @ [phone].

## 2021-10-13 NOTE — Consult Note (Signed)
[facility] ED ASSESSMENT   Reason for Consult:  Suicidal Ideations Referring Physician:  Wyn Quaker, PA Patient Identification: Kyle Carter MRN:  [id number] ED Chief Complaint: Suicidal ideation  Diagnosis:  Principal Problem:   Suicidal ideation Active Problems:   MDD (major depressive disorder), recurrent episode, severe (HCC)   GAD (generalized anxiety disorder)   ED Assessment Time Calculation: Start Time: 1000 Stop Time: 1030 Total Time in Minutes (Assessment Completion): 30   Subjective:   Kyle Carter is a 56 y.o. male patient admitted voluntarily to [facility] after he was having suicidal thoughts at home, picked up his gun and considered killing himself.He texted his wife a goodbye message which in return she called 911.   HPI:    Please see history provided by Windell Hummingbird in counselor assessment  "Kyle Houlton" Carter is a 56 year old patient who was brought to the [facility] due to an incident in which he was going to shoot himself in the head as a means to kill himself. Pt states, "Me and my wife have been having some marital problems. I lied to her about looking at stuff on the internet. I've not treated her like I should have. She told me today she was done with it. I was at the house by myself and at the time I thought it was the right thing to do (by killing myself). I sent her a text message telling her I loved her and I said when she got home she'd find me (dead) in the yard. I was outside with a gun in my hand and I was going to shoot myself in the head. She kept calling and messaging and then her mother, who lives just down the road from [place], was calling me and I didn't answer any of them so my mother-in-law came to the house. My wife was across town and she called 911."  Patient seen today in his room for face to face evaluation. He tells me that he is feeling better today, but is feeling sadness and regret. He tells me he believes a lot of his current issues such as  self worth, depression, chronic lying, and marital issues are revolved around his ETOH use. Pt states he is not drinking daily, but "gets drunk" multiple times per week. Denies any hx of withdrawal seizures or DT. He does feel like his increased depression and suicidal thoughts are due to the stress of his marital problems. He tells me he has been lying and deceitful behind her back, and after the most recent argument is when he thought about taking his life. When I asked him what stopped him from pulling the trigger he stated he thought about all the things he would lose such as his kids and wife, and he didn't want to leave them behind. He does mention there are multiple guns in the home. He does deny current SI and HI. Denies auditory or visual hallucinations. He is sleeping and eating well. He tells me he wants IP treatment so he can work on himself, figure out why he chronically lies and how he can start being more truthful with himself and his wife, and to stop drinking completely.    Past Psychiatric History:  Reported hx of ADHD, Depression, and Anxiety. Denies any previous IP hospitalizations.  Risk to Self or Others: Is the patient at risk to self? Yes Has the patient been a risk to self in the past 6 months? Yes Has the patient been a risk to  self within the distant past? No Is the patient a risk to others? No Has the patient been a risk to others in the past 6 months? No Has the patient been a risk to others within the distant past? No  [place] Scale:  [place] ED from 10/12/2021 in [place] ED from 01/17/2021 in [place] DEPT  C-SSRS RISK CATEGORY High Risk No Risk        ASAM: ASAM Multidimensional Assessment Summary Dimension 1:  Description of individual's past and current experiences of substance use and withdrawal: Pt denies DImension 1:  Acute Intoxication and/or Withdrawal Potential Severity  Rating: None Dimension 2:  Description of patient's biomedical conditions and  complications: Pt denies Dimension 2:  Biomedical Conditions and Complications Severity Rating: None Dimension 3:  Description of emotional, behavioral, or cognitive conditions and complications: Pt states his wife shared thoughts that his anti-depressant isn't working as well due to his EtOH use Dimension 3:  Emotional, behavioral or cognitive (EBC) conditions and complications severity rating: Moderate Dimension 4:  Description of Readiness to Change criteria: Pt shares he wants to improve things for himself and for his marriage Dimension 4:  Readiness to Change Severity Rating: Mild Dimension 5:  Relapse, continued use, or continued problem potential critiera description: Pt has never been to treatment for MH or SA concerns Dimension 5:  Relapse, continued use, or continued problem potential severity rating: Mild Dimension 6:  Recovery/Iiving environment criteria description: Pt lives with his wife and her 26-y-o son Dimension 6:  Recovery/living environment severity rating: Moderate ASAM's Severity Rating Score: 6 ASAM Recommended Level of Treatment: Level I Outpatient Treatment  Substance Abuse:  Alcohol / Drug Use Pain Medications: See MAR Prescriptions: See MAR Over the Counter: See MAR History of alcohol / drug use?: Yes Longest period of sobriety (when/how long): Unknown Negative Consequences of Use:  (Per pt, his wife states she doesn't believe his medication works as well due to him drinking) Withdrawal Symptoms: None (Pt denies)  Past Medical History:  Past Medical History:  Diagnosis Date   ADHD    Anxiety    Depression    Hyperlipidemia    Hypertension    Sleep apnea     Past Surgical History:  Procedure Laterality Date   COLONOSCOPY W/ POLYPECTOMY     Family History:  Family History  Problem Relation Age of Onset   Diabetes Mother    Liver cancer Mother    Diabetes Father      Social History:  Social History   Substance and Sexual Activity  Alcohol Use Yes   Comment: occasional     Social History   Substance and Sexual Activity  Drug Use Not Currently    Social History   Socioeconomic History   Marital status: Married    Spouse name: Not on file   Number of children: Not on file   Years of education: Not on file   Highest education level: Not on file  Occupational History   Not on file  Tobacco Use   Smoking status: Never   Smokeless tobacco: Never  Substance and Sexual Activity   Alcohol use: Yes    Comment: occasional   Drug use: Not Currently   Sexual activity: Not on file  Other Topics Concern   Not on file  Social History Narrative   Not on file   Social Determinants of Health   Financial Resource Strain: Not on file  Food Insecurity:  Not on file  Transportation Needs: Not on file  Physical Activity: Not on file  Stress: Not on file  Social Connections: Not on file   Additional Social History:    Allergies:  No Known Allergies  Labs:  Results for orders placed or performed during the hospital encounter of 10/12/21 (from the past 48 hour(s))  Comprehensive metabolic panel     Status: Abnormal   Collection Time: 10/12/21  7:10 PM  Result Value Ref Range   Sodium 138 135 - 145 mmol/L   Potassium 3.9 3.5 - 5.1 mmol/L   Chloride 106 98 - 111 mmol/L   CO2 21 (L) 22 - 32 mmol/L   Glucose, Bld 99 70 - 99 mg/dL    Comment: Glucose reference range applies only to samples taken after fasting for at least 8 hours.   BUN 12 6 - 20 mg/dL   Creatinine, Ser 4.43 0.61 - 1.24 mg/dL   Calcium 9.1 8.9 - 15.4 mg/dL   Total Protein 6.9 6.5 - 8.1 g/dL   Albumin 4.1 3.5 - 5.0 g/dL   AST 20 15 - 41 U/L   ALT 24 0 - 44 U/L   Alkaline Phosphatase 41 38 - 126 U/L   Total Bilirubin 1.3 (H) 0.3 - 1.2 mg/dL   GFR, Estimated >00 >86 mL/min    Comment: (NOTE) Calculated using the CKD-EPI Creatinine Equation (2021)    Anion gap 11 5 - 15     Comment: Performed at [facility], [address] [place], [place] [address]  Ethanol     Status: None   Collection Time: 10/12/21  7:10 PM  Result Value Ref Range   Alcohol, Ethyl (B) <10 <10 mg/dL    Comment: (NOTE) Lowest detectable limit for serum alcohol is 10 mg/dL.  For medical purposes only. Performed at [facility], [address] [place], [place] [address]   Salicylate level     Status: Abnormal   Collection Time: 10/12/21  7:10 PM  Result Value Ref Range   Salicylate Lvl <7.0 (L) 7.0 - 30.0 mg/dL    Comment: Performed at [facility], [address] [place], [place] [address]  Acetaminophen level     Status: Abnormal   Collection Time: 10/12/21  7:10 PM  Result Value Ref Range   Acetaminophen (Tylenol), Serum <10 (L) 10 - 30 ug/mL    Comment: (NOTE) Therapeutic concentrations vary significantly. A range of 10-30 ug/mL  may be an effective concentration for many patients. However, some  are best treated at concentrations outside of this range. Acetaminophen concentrations >150 ug/mL at 4 hours after ingestion  and >50 ug/mL at 12 hours after ingestion are often associated with  toxic reactions.  Performed at [facility], [address] [place], [place] [address]   cbc     Status: None   Collection Time: 10/12/21  7:10 PM  Result Value Ref Range   WBC 6.6 4.0 - 10.5 K/uL   RBC 5.16 4.22 - 5.81 MIL/uL   Hemoglobin 15.1 13.0 - 17.0 g/dL   HCT 33.8 25.0 - 53.9 %   MCV 88.2 80.0 - 100.0 fL   MCH 29.3 26.0 - 34.0 pg   MCHC 33.2 30.0 - 36.0 g/dL   RDW 76.7 34.1 - 93.7 %   Platelets 240 150 - 400 K/uL   nRBC 0.0 0.0 - 0.2 %    Comment: Performed at [facility], [address]  [place], [place] [address]  Rapid urine drug screen (hospital performed)     Status: None   Collection Time: 10/12/21  7:46 PM  Result Value Ref Range   Opiates NONE DETECTED NONE DETECTED   Cocaine NONE DETECTED NONE DETECTED   Benzodiazepines NONE  DETECTED NONE DETECTED   Amphetamines NONE DETECTED NONE DETECTED   Tetrahydrocannabinol NONE DETECTED NONE DETECTED   Barbiturates NONE DETECTED NONE DETECTED    Comment: (NOTE) DRUG SCREEN FOR MEDICAL PURPOSES ONLY.  IF CONFIRMATION IS NEEDED FOR ANY PURPOSE, NOTIFY LAB WITHIN 5 DAYS.  LOWEST DETECTABLE LIMITS FOR URINE DRUG SCREEN Drug Class                     Cutoff (ng/mL) Amphetamine and metabolites    1000 Barbiturate and metabolites    200 Benzodiazepine                 [id number] Tricyclics and metabolites     300 Opiates and metabolites        300 Cocaine and metabolites        300 THC                            50 Performed at [facility], [place] [address], [place], [place] [address]   Resp Panel by RT-PCR (Flu A&B, Covid) Anterior Nasal Swab     Status: None   Collection Time: 10/13/21  1:15 AM   Specimen: Anterior Nasal Swab  Result Value Ref Range   SARS Coronavirus 2 by RT PCR NEGATIVE NEGATIVE    Comment: (NOTE) SARS-CoV-2 target nucleic acids are NOT DETECTED.  The SARS-CoV-2 RNA is generally detectable in upper respiratory specimens during the acute phase of infection. The lowest concentration of SARS-CoV-2 viral copies this assay can detect is 138 copies/mL. A negative result does not preclude SARS-Cov-2 infection and should not be used as the sole basis for treatment or other patient management decisions. A negative result may occur with  improper specimen collection/handling, submission of specimen other than nasopharyngeal swab, presence of viral mutation(s) within the areas targeted by this assay, and inadequate number of viral copies(<138 copies/mL). A negative result must be combined with clinical observations, patient history, and epidemiological information. The expected result is Negative.  Fact Sheet for Patients:  EntrepreneurPulse.com.au  Fact Sheet for Healthcare Providers:   IncredibleEmployment.be  This test is no t yet approved or cleared by the [place] FDA and  has been authorized for detection and/or diagnosis of SARS-CoV-2 by FDA under an Emergency Use Authorization (EUA). This EUA will remain  in effect (meaning this test can be used) for the duration of the COVID-19 declaration under Section 564(b)(1) of the Act, 21 U.S.C.section 360bbb-3(b)(1), unless the authorization is terminated  or revoked sooner.       Influenza A by PCR NEGATIVE NEGATIVE   Influenza B by PCR NEGATIVE NEGATIVE    Comment: (NOTE) The Xpert Xpress SARS-CoV-2/FLU/RSV plus assay is intended as an aid in the diagnosis of influenza from Nasopharyngeal swab specimens and should not be used as a sole basis for treatment. Nasal washings and aspirates are unacceptable for Xpert Xpress SARS-CoV-2/FLU/RSV testing.  Fact Sheet for Patients: EntrepreneurPulse.com.au  Fact Sheet for Healthcare Providers: IncredibleEmployment.be  This test is not yet approved or cleared by the [place] FDA and has been authorized for detection and/or diagnosis of SARS-CoV-2 by FDA under an Emergency Use Authorization (EUA).  This EUA will remain in effect (meaning this test can be used) for the duration of the COVID-19 declaration under Section 564(b)(1) of the Act, 21 U.S.C. section 360bbb-3(b)(1), unless the authorization is terminated or revoked.  Performed at [facility], [address], [place], [place] [address]     Current Facility-Administered Medications  Medication Dose Route Frequency Provider Last Rate Last Admin   amLODipine (NORVASC) tablet 5 mg  5 mg Oral q AM Jacalyn Lefevre, MD   5 mg at 10/13/21 0865   amphetamine-dextroamphetamine (ADDERALL) tablet 30 mg  30 mg Oral Daily PRN Jacalyn Lefevre, MD       atenolol (TENORMIN) tablet 100 mg  100 mg Oral q morning Jacalyn Lefevre, MD       atorvastatin (LIPITOR)  tablet 10 mg  10 mg Oral q morning Jacalyn Lefevre, MD   10 mg at 10/13/21 0943   busPIRone (BUSPAR) tablet 10 mg  10 mg Oral BID Jacalyn Lefevre, MD   10 mg at 10/13/21 0943   FLUoxetine (PROZAC) capsule 40 mg  40 mg Oral q morning Jacalyn Lefevre, MD   40 mg at 10/13/21 0950   zolpidem (AMBIEN) tablet 10 mg  10 mg Oral QHS PRN Jacalyn Lefevre, MD       Current Outpatient Medications  Medication Sig Dispense Refill   amLODipine (NORVASC) 5 MG tablet Take 5 mg by mouth in the morning.     amphetamine-dextroamphetamine (ADDERALL) 30 MG tablet Take 30 mg by mouth daily as needed (ADHD/to focus).     atenolol (TENORMIN) 100 MG tablet Take 100 mg by mouth every morning.     atorvastatin (LIPITOR) 10 MG tablet Take 10 mg by mouth every morning.     busPIRone (BUSPAR) 10 MG tablet Take 10 mg by mouth 2 (two) times daily.     FLUoxetine (PROZAC) 40 MG capsule Take 40 mg by mouth every morning.     sildenafil (VIAGRA) 100 MG tablet Take 100 mg by mouth daily as needed for erectile dysfunction.     zolpidem (AMBIEN) 10 MG tablet Take 10 mg by mouth at bedtime as needed for sleep.       Psychiatric Specialty Exam: Presentation  General Appearance: Appropriate for Environment  Eye Contact:Good  Speech:Clear and Coherent  Speech Volume:Normal  Handedness:No data recorded  Mood and Affect  Mood:Depressed  Affect:Congruent   Thought Process  Thought Processes:Goal Directed  Descriptions of Associations:Intact  Orientation:Full (Time, Place and Person)  Thought Content:Logical  History of Schizophrenia/Schizoaffective disorder:No  Duration of Psychotic Symptoms:No data recorded Hallucinations:Hallucinations: None  Ideas of Reference:None  Suicidal Thoughts:Suicidal Thoughts: No  Homicidal Thoughts:Homicidal Thoughts: No   Sensorium  Memory:Immediate Good  Judgment:Fair  Insight:Fair   Executive Functions  Concentration:Good  Attention  Span:Good  Recall:Good  Fund of Knowledge:Good  Language:Good   Psychomotor Activity  Psychomotor Activity:Psychomotor Activity: Normal   Assets  Assets:Communication Skills; Housing; Physical Health; Social Support    Sleep  Sleep:Sleep: Good   Physical Exam: Physical Exam Neurological:     Mental Status: He is alert and oriented to person, place, and time.  Psychiatric:        Mood and Affect: Mood is depressed.        Behavior: Behavior is cooperative.        Thought Content: Thought content normal.    Review of Systems  Psychiatric/Behavioral:  Positive for depression and substance abuse.   All other systems reviewed and are negative.  Blood pressure Marland Kitchen)  125/58, pulse (!) 50, temperature 97.8 F (36.6 C), temperature source Oral, resp. rate 17, height 5\' 11"  (1.803 m), weight 108 kg, SpO2 95 %. Body mass index is 33.19 kg/m.  Medical Decision Making: At this time, will continue to recommend inpatient psychiatric treatment. Pt is voluntary and agreeable to this plan. Pt case reviewed and discussed with Dr. Dwyane Dee. EDP, LCSW, and RN notified of disposition. Pt has been accepted to [facility] and can transport there after 1900.   - Home medications continued. No medication changes at this time.  Disposition: Recommend psychiatric Inpatient admission when medically cleared. Supportive therapy provided about ongoing stressors.  Vesta Mixer, NP 10/13/2021 10:45 AM

## 2021-10-13 NOTE — Tx Team (Signed)
Initial Treatment Plan 10/13/2021 10:34 PM [name] [id number]    PATIENT STRESSORS: Marital or family conflict     PATIENT STRENGTHS: Ability for insight  Average or above average intelligence  General fund of knowledge  Special hobby/interest    PATIENT IDENTIFIED PROBLEMS: Alteration in mood depressed  anxiety                   DISCHARGE CRITERIA:  Ability to meet basic life and health needs Improved stabilization in mood, thinking, and/or behavior Need for constant or close observation no longer present Reduction of life-threatening or endangering symptoms to within safe limits  PRELIMINARY DISCHARGE PLAN: Outpatient therapy Return to previous living arrangement Return to previous work or school arrangements  PATIENT/FAMILY INVOLVEMENT: This treatment plan has been presented to and reviewed with the patient, TEDRICK PORT, and/or family member,  The patient and family have been given the opportunity to ask questions and make suggestions.  Cherene Altes, RN 10/13/2021, 10:34 PM

## 2021-10-14 ENCOUNTER — Encounter (HOSPITAL_COMMUNITY): Payer: Self-pay

## 2021-10-14 DIAGNOSIS — F332 Major depressive disorder, recurrent severe without psychotic features: Principal | ICD-10-CM

## 2021-10-14 MED ORDER — TRAZODONE HCL 50 MG PO TABS: 50.0000 mg | ORAL_TABLET | Freq: Every evening | ORAL | Status: DC | PRN

## 2021-10-14 MED ORDER — ESCITALOPRAM OXALATE 5 MG PO TABS
5.0000 mg | ORAL_TABLET | Freq: Every day | ORAL | Status: DC
Start: 2021-10-15 — End: 2021-10-15
  Administered 2021-10-15: 5 mg via ORAL
  Filled 2021-10-14 (×3): qty 1

## 2021-10-14 NOTE — Group Note (Signed)
LCSW Group Therapy Note   Group Date: 10/14/2021 Start Time: 1300 End Time: 1400  Type of Therapy/Topic:  Group Therapy:  Balance in Life  Participation Level:  Active  Description of Group:    This group will address the concept of balance and how it feels and looks when one is unbalanced. Patients will be encouraged to process areas in their lives that are out of balance and identify reasons for remaining unbalanced. Facilitators will guide patients in utilizing problem-solving interventions to address and correct the stressor making their life unbalanced. Understanding and applying boundaries will be explored and addressed for obtaining and maintaining a balanced life. Patients will be encouraged to explore ways to assertively make their unbalanced needs known to significant others in their lives, using other group members and facilitator for support and feedback.  Therapeutic Goals: Patient will identify two or more emotions or situations they have that consume much of in their lives. Patient will identify two ways to set boundaries in order to achieve balance in their lives:   Summary of Patient Progress:  The Pt attended group and remained there the entire time.  The Pt shared openly and participated throughout the group session.  The Pt was appropriate with peers and demonstrated understanding of the topic and ideas being discussed.  The Pt was able to reflect on their emotions and how they can create balance and emotional regulation in their every day life.     Therapeutic Modalities:   Cognitive Behavioral Therapy Solution-Focused Therapy Assertiveness Training  Aram Beecham, [place] 10/14/2021  2:09 PM

## 2021-10-14 NOTE — Progress Notes (Signed)
Pt denies SI/HI/AVH.  Pt reports that his mood is "pretty good today".  Endorsed having anxiety.  Pt makes good eye contact when conversing with others and he is pleasant and cooperative.  Pt took medications without incident.

## 2021-10-14 NOTE — H&P (Addendum)
Psychiatric Admission Assessment Adult  Patient Identification: Kyle Carter MRN:  [id number] Date of Evaluation:  10/14/2021 Chief Complaint:  MDD (major depressive disorder) [F32.9] Principal Diagnosis: MDD (major depressive disorder), recurrent episode, severe (HCC) Diagnosis:  Principal Problem:   MDD (major depressive disorder), recurrent episode, severe (HCC)   CC: "I want to stop lying to my wife"  Kyle Carter is a 56 year old male with a past psychiatric history of 2 episodes of major depressive disorder who presented voluntarily for an aborted suicide attempt involving a gun.  He reports that his wife said "I am leaving you", prompting him to later take a pistol and hold it in his hands while contemplating suicide.  He texted his wife telling her that she would find him "face down in the yard" when she got home.  He presented voluntarily via the police department for assessment and evaluation.  He is admitted to the [facility] behavioral health hospital on a voluntary basis.   Mode of transport to Hospital: Police Department Current Outpatient (Home) Medication List: The patient reports regularly taking Prozac 40 mg daily and BuSpar 10 mg twice daily PRN medication prior to evaluation: None  ED course: Unremarkable Collateral Information: Called patient's wife, Kaelob Persky, at [phone]. Did not answer, left VM.   POA/Legal Guardian: None  HPI:  Per patient report, the patient's wife was receiving an MRI while the patient was home on the day of the events transpired.  He states that they had been arguing for the past several weeks about the patient watching porn and texting other women.  While at home alone, he reports that he received a text message from his wife saying "I am leaving you".  The patient soon developed suicidal thoughts and went and got his gun from the gun case.  He states that he waved the gun around and contemplated suicide but never put the gun to his  head.  He says "I thought about it but then I reconsidered".  He does state that he sent a suicide note to his wife who promptly called 911.  Eventually the police department and an ambulance showed up.  The patient decided to go voluntarily with police officers to be evaluated.  The patient reports that for the past 3 weeks he has been "moody and irritable".  He says that he is more prone to yell at his wife recently.  He also endorses a depressed mood.  He reports struggling with feelings of guilt and worthlessness as well as difficulty concentrating.  The patient is asked if there are any other stressors in his life other than his arguments with his wife.  The patient replies that there are no other stressors in his life.  He is future oriented and euthymic, especially when he is discussing his work situation, as described below.  Of note, the patient requested that it be documented that #1 the patient had not been drinking alcohol on the day of these events transpired.  He reports that his last drink was several days prior.  #2 that there is no problem with his kids that led to his aborted suicide attempt.  Psychiatric review of symptoms is negative for auditory visual hallucinations at any point or homicidal thoughts.  Borderline personality disorder screening is negative.  Bipolar affective disorder screening is negative.  Past Psychiatric Hx: The patient reports that he has had 2 previous episodes of major depressive disorder, both of which occurred after the death of one of his parents.  He reports being started on Prozac in 2010/07/29 after the death of his father.  He reports full compliance with his psychiatric medications.  He denies previous suicide attempts or suicidal thoughts of any kind.  He reports having religious beliefs against suicide.  He denies previous psychiatric hospitalization.  He does report previously seeing a psychiatrist who prescribed him Adderall for ADHD, which he felt was  inappropriate.   Substance Abuse Hx: The patient reports drinking 5-6 beers, 2 to 3 days/week.  He reports that his last drink was approximately 4 days ago.  He denies ever experiencing withdrawal seizures or withdrawal symptoms of any kind.  He denies smoking cigarettes or using illegal drugs.  Past Medical History: The patient reports a past medical history of hyperlipidemia, hypertension, and obstructive sleep apnea.  His home medications have been restarted.  Family History: He denies any family history of psychiatric issues.  Stating that there have been no suicide attempts in his family.  Social History: The patient [place] living in [place] away from [place].  He reports being raised in a town near [place].  He reports living with his wife and stepson at home.  He reports working at [employer] for 2 years.  He states that he likes this job.  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  He reports having obtained high school education with no further education.  He reports that he has 2 biological children who are ages 56 and 38.  He reports that his current marriage is his second marriage.  He states that his first manage ended due to having multiple affairs.   Is the patient at risk to self? Yes.    Has the patient been a risk to self in the past 6 months? Yes.    Has the patient been a risk to self within the distant past? No.  Is the patient a risk to others? No.  Has the patient been a risk to others in the past 6 months? No.  Has the patient been a risk to others within the distant past? No.   Prior Inpatient Therapy:  Prior Outpatient Therapy:    Alcohol Screening: 1. How often do you have a drink containing alcohol?: 2 to 4 times a month 2. How many drinks containing alcohol do you have on a typical day when you are drinking?: 3 or 4 3. How often do you have six or more drinks on one occasion?: Never AUDIT-C Score: 3 4. How often during the  last year have you found that you were not able to stop drinking once you had started?: Never 5. How often during the last year have you failed to do what was normally expected from you because of drinking?: Never 6. How often during the last year have you needed a first drink in the morning to get yourself going after a heavy drinking session?: Never 7. How often during the last year have you had a feeling of guilt of remorse after drinking?: Less than monthly 8. How often during the last year have you been unable to remember what happened the night before because you had been drinking?: Never 9. Have you or someone else been injured as a result of your drinking?: No 10. Has a relative or friend or a doctor or another health worker been concerned about your drinking or suggested you cut down?: No Alcohol Use Disorder Identification Test Final Score (AUDIT): 4 Alcohol Brief Interventions/Follow-up: Alcohol  education/Brief advice Substance Abuse History in the last 12 months:  none Consequences of Substance Abuse: none Previous Psychotropic Medications: Yes  Psychological Evaluations: No  Past Medical History:  Past Medical History:  Diagnosis Date   ADHD    Anxiety    Depression    Hyperlipidemia    Hypertension    Sleep apnea     Past Surgical History:  Procedure Laterality Date   COLONOSCOPY W/ POLYPECTOMY     Family History:  Family History  Problem Relation Age of Onset   Diabetes Mother    Liver cancer Mother    Diabetes Father    Family Psychiatric  History: as above Tobacco Screening:   Social History:  Social History   Substance and Sexual Activity  Alcohol Use Yes   Comment: 2-3 times per wk, unsure of amount     Social History   Substance and Sexual Activity  Drug Use Not Currently    Additional Social History:                           Allergies:  No Known Allergies Lab Results:  Results for orders placed or performed during the hospital  encounter of 10/12/21 (from the past 48 hour(s))  Comprehensive metabolic panel     Status: Abnormal   Collection Time: 10/12/21  7:10 PM  Result Value Ref Range   Sodium 138 135 - 145 mmol/L   Potassium 3.9 3.5 - 5.1 mmol/L   Chloride 106 98 - 111 mmol/L   CO2 21 (L) 22 - 32 mmol/L   Glucose, Bld 99 70 - 99 mg/dL    Comment: Glucose reference range applies only to samples taken after fasting for at least 8 hours.   BUN 12 6 - 20 mg/dL   Creatinine, Ser 1.61 0.61 - 1.24 mg/dL   Calcium 9.1 8.9 - 09.6 mg/dL   Total Protein 6.9 6.5 - 8.1 g/dL   Albumin 4.1 3.5 - 5.0 g/dL   AST 20 15 - 41 U/L   ALT 24 0 - 44 U/L   Alkaline Phosphatase 41 38 - 126 U/L   Total Bilirubin 1.3 (H) 0.3 - 1.2 mg/dL   GFR, Estimated >04 >54 mL/min    Comment: (NOTE) Calculated using the CKD-EPI Creatinine Equation (2021)    Anion gap 11 5 - 15    Comment: Performed at [facility], [address], [place], [place] [address]  Ethanol     Status: None   Collection Time: 10/12/21  7:10 PM  Result Value Ref Range   Alcohol, Ethyl (B) <10 <10 mg/dL    Comment: (NOTE) Lowest detectable limit for serum alcohol is 10 mg/dL.  For medical purposes only. Performed at [facility], [address] [place], [place] [address]   Salicylate level     Status: Abnormal   Collection Time: 10/12/21  7:10 PM  Result Value Ref Range   Salicylate Lvl <7.0 (L) 7.0 - 30.0 mg/dL    Comment: Performed at [facility], [address], [place], [place] [address]  Acetaminophen level     Status: Abnormal   Collection Time: 10/12/21  7:10 PM  Result Value Ref Range   Acetaminophen (Tylenol), Serum <10 (L) 10 - 30 ug/mL    Comment: (NOTE) Therapeutic concentrations vary significantly. A range of 10-30 ug/mL  may be an effective concentration for many patients. However, some  are best treated at concentrations  outside of this range. Acetaminophen concentrations >150 ug/mL at 4 hours after ingestion  and >50  ug/mL at 12 hours after ingestion are often associated with  toxic reactions.  Performed at [facility], [address] [place], [place] [address]   cbc     Status: None   Collection Time: 10/12/21  7:10 PM  Result Value Ref Range   WBC 6.6 4.0 - 10.5 K/uL   RBC 5.16 4.22 - 5.81 MIL/uL   Hemoglobin 15.1 13.0 - 17.0 g/dL   HCT 42.7 06.2 - 37.6 %   MCV 88.2 80.0 - 100.0 fL   MCH 29.3 26.0 - 34.0 pg   MCHC 33.2 30.0 - 36.0 g/dL   RDW 28.3 15.1 - 76.1 %   Platelets 240 150 - 400 K/uL   nRBC 0.0 0.0 - 0.2 %    Comment: Performed at [facility], [address], [place], [place] [address]  Rapid urine drug screen (hospital performed)     Status: None   Collection Time: 10/12/21  7:46 PM  Result Value Ref Range   Opiates NONE DETECTED NONE DETECTED   Cocaine NONE DETECTED NONE DETECTED   Benzodiazepines NONE DETECTED NONE DETECTED   Amphetamines NONE DETECTED NONE DETECTED   Tetrahydrocannabinol NONE DETECTED NONE DETECTED   Barbiturates NONE DETECTED NONE DETECTED    Comment: (NOTE) DRUG SCREEN FOR MEDICAL PURPOSES ONLY.  IF CONFIRMATION IS NEEDED FOR ANY PURPOSE, NOTIFY LAB WITHIN 5 DAYS.  LOWEST DETECTABLE LIMITS FOR URINE DRUG SCREEN Drug Class                     Cutoff (ng/mL) Amphetamine and metabolites    1000 Barbiturate and metabolites    200 Benzodiazepine                 200 Tricyclics and metabolites     300 Opiates and metabolites        300 Cocaine and metabolites        300 THC                            50 Performed at [facility], [address], [place], [place] [address]   Resp Panel by RT-PCR (Flu A&B, Covid) Anterior Nasal Swab     Status: None   Collection Time: 10/13/21  1:15 AM   Specimen: Anterior Nasal Swab  Result Value Ref Range   SARS Coronavirus 2 by RT PCR NEGATIVE NEGATIVE    Comment: (NOTE) SARS-CoV-2 target nucleic acids are NOT DETECTED.  The SARS-CoV-2 RNA is generally detectable in upper respiratory specimens  during the acute phase of infection. The lowest concentration of SARS-CoV-2 viral copies this assay can detect is 138 copies/mL. A negative result does not preclude SARS-Cov-2 infection and should not be used as the sole basis for treatment or other patient management decisions. A negative result may occur with  improper specimen collection/handling, submission of specimen other than nasopharyngeal swab, presence of viral mutation(s) within the areas targeted by this assay, and inadequate number of viral copies(<138 copies/mL). A negative result must be combined with clinical observations, patient history, and epidemiological information. The expected result is Negative.  Fact Sheet for Patients:  BloggerCourse.com  Fact Sheet for Healthcare Providers:  SeriousBroker.it  This test is no t yet approved or cleared by the [place] FDA and  has been authorized for detection and/or diagnosis of  SARS-CoV-2 by FDA under an Emergency Use Authorization (EUA). This EUA will remain  in effect (meaning this test can be used) for the duration of the COVID-19 declaration under Section 564(b)(1) of the Act, 21 U.S.C.section 360bbb-3(b)(1), unless the authorization is terminated  or revoked sooner.       Influenza A by PCR NEGATIVE NEGATIVE   Influenza B by PCR NEGATIVE NEGATIVE    Comment: (NOTE) The Xpert Xpress SARS-CoV-2/FLU/RSV plus assay is intended as an aid in the diagnosis of influenza from Nasopharyngeal swab specimens and should not be used as a sole basis for treatment. Nasal washings and aspirates are unacceptable for Xpert Xpress SARS-CoV-2/FLU/RSV testing.  Fact Sheet for Patients: BloggerCourse.comhttps://www.fda.gov/media/152166/download  Fact Sheet for Healthcare Providers: SeriousBroker.ithttps://www.fda.gov/media/152162/download  This test is not yet approved or cleared by the [place] FDA and has been authorized for detection and/or diagnosis  of SARS-CoV-2 by FDA under an Emergency Use Authorization (EUA). This EUA will remain in effect (meaning this test can be used) for the duration of the COVID-19 declaration under Section 564(b)(1) of the Act, 21 U.S.C. section 360bbb-3(b)(1), unless the authorization is terminated or revoked.  Performed at [facility], [address] [place], [place] [address]     Blood Alcohol level:  Lab Results  Component Value Date   ETH <10 10/12/2021    Metabolic Disorder Labs:  No results found for: "HGBA1C", "MPG" No results found for: "PROLACTIN" No results found for: "CHOL", "TRIG", "HDL", "CHOLHDL", "VLDL", "LDLCALC"  Current Medications: Current Facility-Administered Medications  Medication Dose Route Frequency Provider Last Rate Last Admin   acetaminophen (TYLENOL) tablet 650 mg  650 mg Oral Q6H PRN Eligha Bridegroomoleman, Mikaela, NP       alum & mag hydroxide-simeth (MAALOX/MYLANTA) 200-200-20 MG/5ML suspension 30 mL  30 mL Oral Q4H PRN Eligha Bridegroomoleman, Mikaela, NP       amLODipine (NORVASC) tablet 5 mg  5 mg Oral Daily Eligha Bridegroomoleman, Mikaela, NP       amphetamine-dextroamphetamine (ADDERALL) tablet 30 mg  30 mg Oral Daily PRN Eligha Bridegroomoleman, Mikaela, NP       atenolol (TENORMIN) tablet 100 mg  100 mg Oral q morning Eligha Bridegroomoleman, Mikaela, NP   100 mg at 10/14/21 0934   atorvastatin (LIPITOR) tablet 10 mg  10 mg Oral q morning Eligha Bridegroomoleman, Mikaela, NP   10 mg at 10/14/21 0934   busPIRone (BUSPAR) tablet 10 mg  10 mg Oral BID Eligha Bridegroomoleman, Mikaela, NP   10 mg at 10/14/21 1822   [START ON 10/15/2021] escitalopram (LEXAPRO) tablet 5 mg  5 mg Oral Daily Massengill, Nathan, MD       magnesium hydroxide (MILK OF MAGNESIA) suspension 30 mL  30 mL Oral Daily PRN Eligha Bridegroomoleman, Mikaela, NP       traZODone (DESYREL) tablet 50 mg  50 mg Oral QHS PRN Massengill, Harrold DonathNathan, MD       zolpidem (AMBIEN) tablet 10 mg  10 mg Oral QHS PRN Massengill, Harrold DonathNathan, MD   10 mg at 10/13/21 2211   PTA Medications: Medications Prior to Admission  Medication  Sig Dispense Refill Last Dose   amLODipine (NORVASC) 5 MG tablet Take 5 mg by mouth in the morning.   10/12/2021   amphetamine-dextroamphetamine (ADDERALL) 30 MG tablet Take 30 mg by mouth daily as needed (ADHD/to focus).   Past Week   atenolol (TENORMIN) 100 MG tablet Take 100 mg by mouth every morning.   10/13/2021   atorvastatin (LIPITOR) 10 MG tablet Take 10 mg by mouth every morning.  10/13/2021   busPIRone (BUSPAR) 10 MG tablet Take 10 mg by mouth 2 (two) times daily.   10/13/2021   FLUoxetine (PROZAC) 40 MG capsule Take 40 mg by mouth every morning.   10/12/2021   zolpidem (AMBIEN) 10 MG tablet Take 10 mg by mouth at bedtime as needed for sleep.   Past Week   sildenafil (VIAGRA) 100 MG tablet Take 100 mg by mouth daily as needed for erectile dysfunction.   Unknown    Musculoskeletal: Strength & Muscle Tone: within normal limits Gait & Station: normal Patient leans: N/A            Psychiatric Specialty Exam:  Presentation  General Appearance: Appropriate for Environment  Eye Contact:Good  Speech:Clear and Coherent  Speech Volume:Normal  Handedness:No data recorded  Mood and Affect  Mood:Depressed  Affect:Congruent   Thought Process  Thought Processes:Goal Directed  Duration of Psychotic Symptoms: No data recorded Past Diagnosis of Schizophrenia or Psychoactive disorder: No  Descriptions of Associations:Intact  Orientation:Full (Time, Place and Person)  Thought Content:Logical  Hallucinations:Hallucinations: None  Ideas of Reference:None  Suicidal Thoughts:Suicidal Thoughts: No  Homicidal Thoughts:Homicidal Thoughts: No   Sensorium  Memory:Immediate Good  Judgment:Fair  Insight:Fair   Executive Functions  Concentration:Good  Attention Span:Good  Recall:Good  Fund of Knowledge:Good  Language:Good   Psychomotor Activity  Psychomotor Activity:Psychomotor Activity: Normal   Assets  Assets:Communication Skills; Housing; Physical  Health; Social Support   Sleep  Sleep:Sleep: Good   Physical Exam Constitutional:      Appearance: the patient is not toxic-appearing.  Pulmonary:     Effort: Pulmonary effort is normal.  Neurological:     General: No focal deficit present.     Mental Status: the patient is alert and oriented to person, place, and time.   Review of Systems  Respiratory:  Negative for shortness of breath.   Cardiovascular:  Negative for chest pain.  Gastrointestinal:  Negative for abdominal pain, constipation, diarrhea, nausea and vomiting.  Neurological:  Negative for headaches.   Blood pressure (!) 99/56, pulse 69, temperature 97.8 F (36.6 C), temperature source Oral, resp. rate 18, height 5\' 11"  (1.803 m), weight 106.6 kg, SpO2 99 %. Body mass index is 32.78 kg/m.  Treatment Plan Summary: Daily contact with patient to assess and evaluate symptoms and progress in treatment and Medication management   Physician Treatment Plan for Primary Diagnosis: MDD (major depressive disorder), recurrent episode, severe (HCC) Long Term Goal(s): Improvement in symptoms so as ready for discharge  Short Term Goals: Ability to identify changes in lifestyle to reduce recurrence of condition will improve  Physician Treatment Plan for Secondary Diagnosis: Principal Problem:   MDD (major depressive disorder), recurrent episode, severe (HCC)  Long Term Goal(s): Improvement in symptoms so as ready for discharge  Short Term Goals: Compliance with prescribed medications will improve   ASSESSMENT:  Diagnoses / Active Problems: Major depressive disorder, recurrent, severe  PLAN: Safety and Monitoring:  --  Voluntary admission to inpatient psychiatric unit for safety, stabilization and treatment  -- Daily contact with patient to assess and evaluate symptoms and progress in treatment  -- Patient's case to be discussed in multi-disciplinary team meeting  -- Observation Level : q15 minute checks  -- Vital  signs:  q12 hours  -- Precautions: suicide, elopement, and assault  2. Psychiatric Diagnoses and Treatment:  Major depressive disorder, recurrent, severe -- Stop Prozac -- Begin Lexapro 5 mg daily for mood -- Continue Buspar 10 mg BID -- Stop home Adderall --  Continue home Ambien at 5 mg (patient has regular fills) --  The risks/benefits/side-effects/alternatives to this medication were discussed in detail with the patient and time was given for questions. The patient consents to medication trial.   -- Encouraged patient to participate in unit milieu and in scheduled group therapies   -- Short Term Goals: Ability to identify changes in lifestyle to reduce recurrence of condition will improve  -- Long Term Goals: Improvement in symptoms so as ready for discharge    3. Medical Issues Being Addressed:   Tobacco Use Disorder  -- Nicotine patch 21mg /24 hours ordered  -- Smoking cessation encouraged  No need for CIWA or COWS given patient's report, but will continue to monitor  Labs  --TSH and B12 pending  HLD, HTN, OSA  Home medications restarted, patient allowed to bring CPAP  4. Discharge Planning:   -- Social work and case management to assist with discharge planning and identification of hospital follow-up needs prior to discharge  -- Estimated LOS: 5-7 days  -- Discharge Concerns: Need to establish a safety plan; Medication compliance and effectiveness  -- Discharge Goals: Return home with outpatient referrals for mental health follow-up including medication management/psychotherapy  I certify that inpatient services furnished can reasonably be expected to improve the patient's condition.    09-22-1985, MD 7/12/20236:27 PM

## 2021-10-14 NOTE — Progress Notes (Signed)
Received call back from the patient's wife, Markos Theil.   Confirmed aspects of the history of the the patient's wife, including the patient's employment status and the fact that the patient has never attempted suicide previously, has never made suicidal statements previously, and has not been to a psychiatric hospital. There relationship began in 2000.   She clarifies aspects of the history that the patient may have glossed over. Reports his alcohol use is higher than the patient has indicated to this provider. The patient's wife reports he had a DWI and often drinks 6-8 beers while working in the yard. She reports the patient has anger problems and occasionally pushes her when upset. She states "he constantly has to have attention". She feels he constantly is distracted at home and never pays attention to her.   Her understanding of the aborted suicide attempt: She knew about him texting other females he was working with in an inappropriate way. They had an argument about this earlier in the day. Later she left for an MRI and they were texting. She texted him "this is your last chance, but I can't do this anymore with you". She intended this to mean that he would be kicked out of the house permanently and their relationship would end. He texted her back "I can't do this without you". There was more argument via text and then he texted "I love you, just know that, and when you find me, you'll see me in the yard".   She states that she intends to have a romantic relationship with him going forward.    Carlyn Reichert, MD PGY-2

## 2021-10-14 NOTE — BHH Suicide Risk Assessment (Signed)
[facility] Admission Suicide Risk Assessment   Nursing information obtained from:  Patient Demographic factors:  Male, Caucasian Current Mental Status:  Suicidal ideation indicated by patient, Suicidal ideation indicated by others, Self-harm behaviors Loss Factors:  NA Historical Factors:  Impulsivity Risk Reduction Factors:  Living with another person, especially a relative   Principal Problem: MDD (major depressive disorder), recurrent episode, severe (HCC) Diagnosis:  Principal Problem:   MDD (major depressive disorder), recurrent episode, severe (HCC)  Subjective Data:  Kyle Carter Age is a 56 year old male with a past psychiatric history of 2 episodes of major depressive disorder who presented voluntarily for an aborted suicide attempt involving a gun.  He reports that his wife said "I am leaving you", prompting him to later take a pistol and hold it in his hands while contemplating suicide.  He texted his wife telling her that she would find him "face down in the yard" when she got home.  He presented voluntarily via the police department for assessment and evaluation.  He is admitted to the [facility] behavioral health hospital on a voluntary basis.     Mode of transport to Hospital: Police Department Current Outpatient (Home) Medication List: The patient reports regularly taking Prozac 40 mg daily and BuSpar 10 mg twice daily PRN medication prior to evaluation: None   ED course: Unremarkable Collateral Information: Called patient's wife, Kyle Carter, at [phone]. Did not answer, left VM.    POA/Legal Guardian: None   HPI:  Per patient report, the patient's wife was receiving an MRI while the patient was home on the day of the events transpired.  He states that they had been arguing for the past several weeks about the patient watching porn and texting other women.  While at home alone, he reports that he received a text message from his wife saying "I am leaving you".  The patient  soon developed suicidal thoughts and went and got his gun from the gun case.  He states that he waved the gun around and contemplated suicide but never put the gun to his head.  He says "I thought about it but then I reconsidered".  He does state that he sent a suicide note to his wife who promptly called 911.  Eventually the police department and an ambulance showed up.  The patient decided to go voluntarily with police officers to be evaluated.   The patient reports that for the past 3 weeks he has been "moody and irritable".  He says that he is more prone to yell at his wife recently.  He also endorses a depressed mood.  He reports struggling with feelings of guilt and worthlessness as well as difficulty concentrating.  The patient is asked if there are any other stressors in his life other than his arguments with his wife.  The patient replies that there are no other stressors in his life.  He is future oriented and euthymic, especially when he is discussing his work situation, as described below.   Of note, the patient requested that it be documented that #1 the patient had not been drinking alcohol on the day of these events transpired.  He reports that his last drink was several days prior.  #2 that there is no problem with his kids that led to his aborted suicide attempt.   Psychiatric review of symptoms is negative for auditory visual hallucinations at any point or homicidal thoughts.  Borderline personality disorder screening is negative.  Bipolar affective disorder screening is negative.  Past Psychiatric Hx: The patient reports that he has had 2 previous episodes of major depressive disorder, both of which occurred after the death of one of his parents.  He reports being started on Prozac in 2010-07-31 after the death of his father.  He reports full compliance with his psychiatric medications.  He denies previous suicide attempts or suicidal thoughts of any kind.  He reports having religious beliefs  against suicide.  He denies previous psychiatric hospitalization.  He does report previously seeing a psychiatrist who prescribed him Adderall for ADHD, which he felt was inappropriate.    Substance Abuse Hx: The patient reports drinking 5-6 beers, 2 to 3 days/week.  He reports that his last drink was approximately 4 days ago.  He denies ever experiencing withdrawal seizures or withdrawal symptoms of any kind.  He denies smoking cigarettes or using illegal drugs.   Past Medical History: The patient reports a past medical history of hyperlipidemia, hypertension, and obstructive sleep apnea.  His home medications have been restarted.   Family History: He denies any family history of psychiatric issues.  Stating that there have been no suicide attempts in his family.   Social History: The patient [place] living in [place] minutes away from [place].  He reports being raised in a town near [place].  He reports living with his wife and stepson at home.  He reports working at [employer] for 2 years.  He states that he likes this job.  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  He reports having obtained high school education with no further education.  He reports that he has 2 biological children who are ages 14 and 4.  He reports that his current marriage is his second marriage.  He states that his first manage ended due to having multiple affairs.  Continued Clinical Symptoms:  Alcohol Use Disorder Identification Test Final Score (AUDIT): 4 The "Alcohol Use Disorders Identification Test", Guidelines for Use in Primary Care, Second Edition.  World Science writer [facility]). Score between 0-7:  no or low risk or alcohol related problems. Score between 8-15:  moderate risk of alcohol related problems. Score between 16-19:  high risk of alcohol related problems. Score 20 or above:  warrants further diagnostic evaluation for alcohol dependence and treatment.   CLINICAL  FACTORS:   Depression   Musculoskeletal: Strength & Muscle Tone: within normal limits Gait & Station: normal Patient leans: N/A                       Psychiatric Specialty Exam:   Presentation  General Appearance: Appropriate for Environment   Eye Contact:Good   Speech:Clear and Coherent   Speech Volume:Normal   Handedness:No data recorded   Mood and Affect  Mood:Depressed   Affect:Congruent     Thought Process  Thought Processes:Goal Directed   Duration of Psychotic Symptoms: No data recorded Past Diagnosis of Schizophrenia or Psychoactive disorder: No   Descriptions of Associations:Intact   Orientation:Full (Time, Place and Person)   Thought Content:Logical   Hallucinations:Hallucinations: None   Ideas of Reference:None   Suicidal Thoughts:Suicidal Thoughts: No   Homicidal Thoughts:Homicidal Thoughts: No     Sensorium  Memory:Immediate Good   Judgment:Fair   Insight:Fair     Executive Functions  Concentration:Good   Attention Span:Good   Recall:Good   Fund of Knowledge:Good   Language:Good     Psychomotor Activity  Psychomotor Activity:Psychomotor Activity: Normal     Assets  Assets:Communication Skills; Housing; Physical Health; Social Support     Sleep  Sleep:Sleep: Good     Physical Exam Constitutional:      Appearance: the patient is not toxic-appearing.  Pulmonary:     Effort: Pulmonary effort is normal.  Neurological:     General: No focal deficit present.     Mental Status: the patient is alert and oriented to person, place, and time.    Review of Systems  Respiratory:  Negative for shortness of breath.   Cardiovascular:  Negative for chest pain.  Gastrointestinal:  Negative for abdominal pain, constipation, diarrhea, nausea and vomiting.  Neurological:  Negative for headaches.     Blood pressure (!) 99/56, pulse 69, temperature 97.8 F (36.6 C), temperature source Oral, resp. rate 18, height 5\' 11"   (1.803 m), weight 106.6 kg, SpO2 99 %. Body mass index is 32.78 kg/m.   COGNITIVE FEATURES THAT CONTRIBUTE TO RISK:  None    SUICIDE RISK:   Moderate: Patient with demographic risk factors for completed suicide (namely, middle-aged white male) appears to have made an impulsive and serious contemplation of suicide.  The patient appears to have untreated depression and expect that this risk assessment will improve with proper treatment, group therapy and a good discharge plan.  PLAN OF CARE:  Safety and Monitoring:             --  Voluntary admission to inpatient psychiatric unit for safety, stabilization and treatment             -- Daily contact with patient to assess and evaluate symptoms and progress in treatment             -- Patient's case to be discussed in multi-disciplinary team meeting             -- Observation Level : q15 minute checks             -- Vital signs:  q12 hours             -- Precautions: suicide, elopement, and assault   2. Psychiatric Diagnoses and Treatment:  Major depressive disorder, recurrent, severe -- Stop Prozac -- Begin Lexapro 5 mg daily for mood -- Continue Buspar 10 mg BID -- Stop home Adderall -- Continue home Ambien at 5 mg (patient has regular fills) --  The risks/benefits/side-effects/alternatives to this medication were discussed in detail with the patient and time was given for questions. The patient consents to medication trial.              -- Encouraged patient to participate in unit milieu and in scheduled group therapies              -- Short Term Goals: Ability to identify changes in lifestyle to reduce recurrence of condition will improve             -- Long Term Goals: Improvement in symptoms so as ready for discharge                3. Medical Issues Being Addressed:              Tobacco Use Disorder             -- Nicotine patch 21mg /24 hours ordered             -- Smoking cessation encouraged             No need for CIWA or COWS  given patient's report, but will continue  to monitor             Labs             --TSH and B12 pending             HLD, HTN, OSA             Home medications restarted, patient allowed to bring CPAP   4. Discharge Planning:              -- Social work and case management to assist with discharge planning and identification of hospital follow-up needs prior to discharge             -- Estimated LOS: 5-7 days             -- Discharge Concerns: Need to establish a safety plan; Medication compliance and effectiveness             -- Discharge Goals: Return home with outpatient referrals for mental health follow-up including medication management/psychotherapy  I certify that inpatient services furnished can reasonably be expected to improve the patient's condition.   Carlyn Reichert, MD 10/14/2021, 7:01 PM

## 2021-10-14 NOTE — Group Note (Signed)
Date:  10/14/2021 Time:  6:49 PM  Group Topic/Focus:  Coping With Mental Health Crisis:   The purpose of this group is to help patients identify strategies for coping with mental health crisis.  Group discusses possible causes of crisis and ways to manage them effectively.    Participation Level:  Active  Participation Quality:  Appropriate  Affect:  Appropriate  Cognitive:  Appropriate  Insight: Appropriate  Engagement in Group:  Engaged  Modes of Intervention:  Education  Additional Comments:     Reymundo Poll 10/14/2021, 6:49 PM

## 2021-10-14 NOTE — Progress Notes (Signed)
   10/14/21 2100  Psych Admission Type (Psych Patients Only)  Admission Status Voluntary  Psychosocial Assessment  Patient Complaints Anxiety  Eye Contact Fair  Facial Expression Anxious  Affect Sad  Speech Logical/coherent  Interaction Assertive  Motor Activity Fidgety  Appearance/Hygiene Unremarkable  Behavior Characteristics Cooperative  Mood Depressed  Aggressive Behavior  Effect No apparent injury  Thought Process  Coherency Circumstantial  Content Blaming self  Delusions WDL  Perception WDL  Hallucination None reported or observed  Judgment Limited  Confusion WDL  Danger to Self  Current suicidal ideation? Denies

## 2021-10-14 NOTE — Group Note (Signed)
Date:  10/14/2021 Time:  11:55 AM  Group Topic/Focus:  Self Care:   The focus of this group is to help patients understand the importance of self-care in order to improve or restore emotional, physical, spiritual, interpersonal, and financial health.    Participation Level:  Active  Participation Quality:  Attentive  Affect:  Appropriate  Cognitive:  Appropriate  Insight: Good  Engagement in Group:  Engaged  Modes of Intervention:  Exploration  Additional Comments:  Patient was engaged in learning about self-acceptance and mindfulness, as evidenced by re-calling the three concepts of mindfullness.   Reymundo Poll 10/14/2021, 11:55 AM

## 2021-10-14 NOTE — Progress Notes (Signed)
   10/14/21 0553  Sleep  Number of Hours 6.75

## 2021-10-14 NOTE — Group Note (Signed)
Recreation Therapy Group Note   Group Topic:Team Building  Group Date: 10/14/2021 Start Time: 0935 End Time: 0950 Facilitators: Caroll Rancher, LRT,CTRS Location: [address] Dayroom   Goal Area(s) Addresses:  Patient will effectively work with peer towards shared goal.  Patient will identify skills used to make activity successful.  Patient will identify how skills used during activity can be applied to reach post d/c goals.    Group Description: [employer]. In teams of 5-6, patients were given 20 small craft pipe cleaners. Using the materials provided, patients were instructed to compete again the opposing team(s) to build the tallest free-standing structure from floor level. The activity was timed; difficulty increased by Clinical research associate as Production designer, theatre/television/film continued.  Systematically resources were removed with additional directions for example, placing one arm behind their back, working in silence, and shape stipulations. LRT facilitated post-activity discussion reviewing team processes and necessary communication skills involved in completion. Patients were encouraged to reflect how the skills utilized, or not utilized, in this activity can be incorporated to positively impact support systems post discharge.   Affect/Mood: Appropriate   Participation Level: Engaged   Participation Quality: Independent   Behavior: Appropriate   Speech/Thought Process: Focused   Insight: Good   Judgement: Good   Modes of Intervention: Team-building   Patient Response to Interventions:  Engaged   Education Outcome:  Acknowledges education and In group clarification offered    Clinical Observations/Individualized Feedback: Pt attended group and assisted peers in completing the activity.     Plan: Continue to engage patient in RT group sessions 2-3x/week.   Caroll Rancher, LRT,CTRS 10/14/2021 11:30 AM

## 2021-10-14 NOTE — Progress Notes (Signed)
Patient rated his day as an 8 out of 10 because the groups helped a great deal. His positive event for the day was that he had a good visit with his wife.

## 2021-10-14 NOTE — BH IP Treatment Plan (Signed)
Interdisciplinary Treatment and Diagnostic Plan Update  10/14/2021 Time of Session: 9:20am  GRIER VU MRN: [id number]  Principal Diagnosis: MDD (major depressive disorder)  Secondary Diagnoses: Principal Problem:   MDD (major depressive disorder)   Current Medications:  Current Facility-Administered Medications  Medication Dose Route Frequency Provider Last Rate Last Admin   acetaminophen (TYLENOL) tablet 650 mg  650 mg Oral Q6H PRN Vesta Mixer, NP       alum & mag hydroxide-simeth (MAALOX/MYLANTA) 200-200-20 MG/5ML suspension 30 mL  30 mL Oral Q4H PRN Vesta Mixer, NP       amLODipine (NORVASC) tablet 5 mg  5 mg Oral Daily Vesta Mixer, NP       amphetamine-dextroamphetamine (ADDERALL) tablet 30 mg  30 mg Oral Daily PRN Vesta Mixer, NP       atenolol (TENORMIN) tablet 100 mg  100 mg Oral q morning Vesta Mixer, NP   100 mg at 10/14/21 0934   atorvastatin (LIPITOR) tablet 10 mg  10 mg Oral q morning Vesta Mixer, NP   10 mg at 10/14/21 0934   busPIRone (BUSPAR) tablet 10 mg  10 mg Oral BID Vesta Mixer, NP   10 mg at 10/14/21 0934   FLUoxetine (PROZAC) capsule 40 mg  40 mg Oral q morning Vesta Mixer, NP   40 mg at 10/14/21 0934   magnesium hydroxide (MILK OF MAGNESIA) suspension 30 mL  30 mL Oral Daily PRN Vesta Mixer, NP       zolpidem (AMBIEN) tablet 10 mg  10 mg Oral QHS PRN Vesta Mixer, NP   10 mg at 10/13/21 2211   PTA Medications: Medications Prior to Admission  Medication Sig Dispense Refill Last Dose   amLODipine (NORVASC) 5 MG tablet Take 5 mg by mouth in the morning.   10/12/2021   amphetamine-dextroamphetamine (ADDERALL) 30 MG tablet Take 30 mg by mouth daily as needed (ADHD/to focus).   Past Week   atenolol (TENORMIN) 100 MG tablet Take 100 mg by mouth every morning.   10/13/2021   atorvastatin (LIPITOR) 10 MG tablet Take 10 mg by mouth every morning.   10/13/2021   busPIRone (BUSPAR) 10 MG tablet Take 10 mg by mouth 2 (two)  times daily.   10/13/2021   FLUoxetine (PROZAC) 40 MG capsule Take 40 mg by mouth every morning.   10/12/2021   zolpidem (AMBIEN) 10 MG tablet Take 10 mg by mouth at bedtime as needed for sleep.   Past Week   sildenafil (VIAGRA) 100 MG tablet Take 100 mg by mouth daily as needed for erectile dysfunction.   Unknown    Patient Stressors: Marital or family conflict    Patient Strengths: Ability for insight  Average or above average intelligence  General fund of knowledge  Special hobby/interest   Treatment Modalities: Medication Management, Group therapy, Case management,  1 to 1 session with clinician, Psychoeducation, Recreational therapy.   Physician Treatment Plan for Primary Diagnosis: MDD (major depressive disorder) Long Term Goal(s):     Short Term Goals:    Medication Management: Evaluate patient's response, side effects, and tolerance of medication regimen.  Therapeutic Interventions: 1 to 1 sessions, Unit Group sessions and Medication administration.  Evaluation of Outcomes: Not Met  Physician Treatment Plan for Secondary Diagnosis: Principal Problem:   MDD (major depressive disorder)  Long Term Goal(s):     Short Term Goals:       Medication Management: Evaluate patient's response, side effects, and tolerance of medication regimen.  Therapeutic Interventions: 1 to 1  sessions, Unit Group sessions and Medication administration.  Evaluation of Outcomes: Not Met   RN Treatment Plan for Primary Diagnosis: MDD (major depressive disorder) Long Term Goal(s): Knowledge of disease and therapeutic regimen to maintain health will improve  Short Term Goals: Ability to remain free from injury will improve, Ability to participate in decision making will improve, Ability to verbalize feelings will improve, Ability to disclose and discuss suicidal ideas, and Ability to identify and develop effective coping behaviors will improve  Medication Management: RN will administer  medications as ordered by provider, will assess and evaluate patient's response and provide education to patient for prescribed medication. RN will report any adverse and/or side effects to prescribing provider.  Therapeutic Interventions: 1 on 1 counseling sessions, Psychoeducation, Medication administration, Evaluate responses to treatment, Monitor vital signs and CBGs as ordered, Perform/monitor CIWA, COWS, AIMS and Fall Risk screenings as ordered, Perform wound care treatments as ordered.  Evaluation of Outcomes: Not Met   LCSW Treatment Plan for Primary Diagnosis: MDD (major depressive disorder) Long Term Goal(s): Safe transition to appropriate next level of care at discharge, Engage patient in therapeutic group addressing interpersonal concerns.  Short Term Goals: Engage patient in aftercare planning with referrals and resources, Increase social support, Increase emotional regulation, Facilitate acceptance of mental health diagnosis and concerns, Identify triggers associated with mental health/substance abuse issues, and Increase skills for wellness and recovery  Therapeutic Interventions: Assess for all discharge needs, 1 to 1 time with Social worker, Explore available resources and support systems, Assess for adequacy in community support network, Educate family and significant other(s) on suicide prevention, Complete Psychosocial Assessment, Interpersonal group therapy.  Evaluation of Outcomes: Not Met   Progress in Treatment: Attending groups: Yes. Participating in groups: Yes. Taking medication as prescribed: Yes. Toleration medication: Yes. Family/Significant other contact made: Yes, individual(s) contacted:  If consents are provided  Patient understands diagnosis: Yes. Discussing patient identified problems/goals with staff: Yes. Medical problems stabilized or resolved: Yes. Denies suicidal/homicidal ideation: Yes. Issues/concerns per patient self-inventory: No.   New  problem(s) identified: No, Describe:  None   New Short Term/Long Term Goal(s): medication stabilization, elimination of SI thoughts, development of comprehensive mental wellness plan.   Patient Goals: "To stop drinking and to be more truthful"   Discharge Plan or Barriers: Patient recently admitted. CSW will continue to follow and assess for appropriate referrals and possible discharge planning.   Reason for Continuation of Hospitalization: Anxiety Depression Medication stabilization Suicidal ideation  Estimated Length of Stay: 3 to 7 days   Last 3 [place] Suicide Severity Risk Score: [place] Admission (Current) from 10/13/2021 in [place] [facility] ED from 10/12/2021 in [place] ED from 01/17/2021 in [place] [facility]PT  C-SSRS RISK CATEGORY Error: Q7 should not be populated when Q6 is No High Risk No Risk       Last PHQ 2/9 Scores:    10/12/2021   11:44 PM  Depression screen PHQ 2/9  Decreased Interest 1  Down, Depressed, Hopeless 1  PHQ - 2 Score 2  Altered sleeping 1  Tired, decreased energy 1  Change in appetite 0  Feeling bad or failure about yourself  1  Trouble concentrating 1  Moving slowly or fidgety/restless 0  Suicidal thoughts 1  PHQ-9 Score 7  Difficult doing work/chores Very difficult    Scribe for Treatment Team: Darleen Crocker, Latanya Presser 10/14/2021 2:45 PM

## 2021-10-15 LAB — TSH: TSH: 1.697 u[IU]/mL (ref 0.350–4.500)

## 2021-10-15 LAB — VITAMIN B12: Vitamin B-12: 184 pg/mL (ref 180–914)

## 2021-10-15 MED ORDER — ESCITALOPRAM OXALATE 10 MG PO TABS
10.0000 mg | ORAL_TABLET | Freq: Every day | ORAL | Status: DC
  Administered 2021-10-16 – 2021-10-18 (×3): 10 mg via ORAL
  Filled 2021-10-15 (×5): qty 1
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
## 2021-10-15 NOTE — Progress Notes (Signed)
Adult Psychoeducational Group Note  Date:  10/15/2021 Time:  9:59 AM  Group Topic/Focus:  Goals Group:   The focus of this group is to help patients establish daily goals to achieve during treatment and discuss how the patient can incorporate goal setting into their daily lives to aide in recovery.  Participation Level:  Active  Participation Quality:  Appropriate  Affect:  Appropriate  Cognitive:  Appropriate  Insight: Appropriate  Engagement in Group:  Engaged  Modes of Intervention:  Discussion  Additional Comments: THe patient expressed that  his goal for today is to be better today.  Octavio Manns 10/15/2021, 9:59 AM

## 2021-10-15 NOTE — Progress Notes (Addendum)
[facility] MD Progress Note  10/15/2021 3:03 PM MALIKYE REPPOND  MRN:  [id number] Subjective:   Kyle Carter is a 56 year old male with a past psychiatric history of 2 episodes of major depressive disorder who presented voluntarily for an aborted suicide attempt involving a gun.  He reports that his wife said "I am leaving you", prompting him to later take a pistol and hold it in his hands while contemplating suicide.  He texted his wife telling her that she would find him "face down in the yard" when she got home.  He presented voluntarily via the police department for assessment and evaluation.  He is admitted to the [facility] behavioral [facility] hospital on a voluntary basis.   Yesterday, the following recommendations were made:  -- Stop Prozac -- Continue Lexapro 5 mg daily for mood -- Continue Buspar 10 mg BID -- Stop home Adderall -- Continue home Ambien at 5 mg (patient has regular fills)  Interval History: PRN Medications administered within the last 24 hours: Ambien 10mg  for sleep Per nursing staff: In a good mood, no complaints.   Per Patient:  On assessment today, the patient reports a "really good" mood. Denies SI, HI, hallucinations, and delusions. Denies any physical problems. He reports the groups are really helping and he has a good visit with his wife yesterday. His appetite is intact, and he is sleeping well with his CPAP and Ambien. He had no problems taking medications, and has not had any side effects. He notes his wife noticed a distinct change in his behavior and appearance since admission to [facility], and he is hopeful for their relationship. He plans to continue outpatient individual therapy and start outpatient couples' therapy. He was told he has an appointment with an outpatient therapist scheduled for Monday, July 17 at 12pm, and hopes for discharge before then. We discussed our concern for gun safety moving forward given his report that he and his wife own many guns, and he agreed  to keep the guns in a safe where only his wife will know the pass code. He will need to get permission from his wife should he need to use the guns in the future. He agrees to the plan. Will continue to observe.   Patient denies side effects to current scheduled psychiatric medications.   Patient denies other somatic complaints.    Principal Problem: MDD (major depressive disorder), recurrent episode, severe (HCC) Diagnosis: Principal Problem:   MDD (major depressive disorder), recurrent episode, severe (HCC)   Past Psychiatric History: 2 previous episodes episodes of Major Depressive Disorder  Past Medical History:  Past Medical History:  Diagnosis Date   ADHD    Anxiety    Depression    Hyperlipidemia    Hypertension    Sleep apnea     Past Surgical History:  Procedure Laterality Date   COLONOSCOPY W/ POLYPECTOMY     Family History:  Family History  Problem Relation Age of Onset   Diabetes Mother    Liver cancer Mother    Diabetes Father    Family Psychiatric  History: Denies   Social History:  Social History   Substance and Sexual Activity  Alcohol Use Yes   Comment: 2-3 times per wk, unsure of amount     Social History   Substance and Sexual Activity  Drug Use Not Currently    Social History   Socioeconomic History   Marital status: Married    Spouse name: Not on file   Number of children:  Not on file   Years of education: Not on file   Highest education level: Not on file  Occupational History   Not on file  Tobacco Use   Smoking status: Never   Smokeless tobacco: Never  Vaping Use   Vaping Use: Never used  Substance and Sexual Activity   Alcohol use: Yes    Comment: 2-3 times per wk, unsure of amount   Drug use: Not Currently   Sexual activity: Yes  Other Topics Concern   Not on file  Social History Narrative   Not on file   Social Determinants of Health   Financial Resource Strain: Not on file  Food Insecurity: Not on file   Transportation Needs: Not on file  Physical Activity: Not on file  Stress: Not on file  Social Connections: Not on file   Additional Social History:     The patient [place] away from [place].  He reports being raised in a town near [place].  He reports living with his wife and stepson at home.  He reports working at [employer] for 2 years.  He states that he likes this job.  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  He reports having obtained high school education with no further education.  He reports that he has 2 biological children who are ages 52 and 48.  He reports that his current marriage is his second marriage.  He states that his first marriage ended due to having multiple affairs.      Sleep: Good  Appetite:  Good  Current Medications: Current Facility-Administered Medications  Medication Dose Route Frequency Provider Last Rate Last Admin   acetaminophen (TYLENOL) tablet 650 mg  650 mg Oral Q6H PRN Eligha Bridegroom, NP       alum & mag hydroxide-simeth (MAALOX/MYLANTA) 200-200-20 MG/5ML suspension 30 mL  30 mL Oral Q4H PRN Eligha Bridegroom, NP       amLODipine (NORVASC) tablet 5 mg  5 mg Oral Daily Eligha Bridegroom, NP   5 mg at 10/15/21 0957   atenolol (TENORMIN) tablet 100 mg  100 mg Oral q morning Eligha Bridegroom, NP   100 mg at 10/15/21 0957   atorvastatin (LIPITOR) tablet 10 mg  10 mg Oral q morning Eligha Bridegroom, NP   10 mg at 10/15/21 0957   busPIRone (BUSPAR) tablet 10 mg  10 mg Oral BID Eligha Bridegroom, NP   10 mg at 10/15/21 0957   escitalopram (LEXAPRO) tablet 5 mg  5 mg Oral Daily Massengill, Nathan, MD   5 mg at 10/15/21 0957   magnesium hydroxide (MILK OF MAGNESIA) suspension 30 mL  30 mL Oral Daily PRN Eligha Bridegroom, NP       traZODone (DESYREL) tablet 50 mg  50 mg Oral QHS PRN Massengill, Harrold Donath, MD       zolpidem (AMBIEN) tablet 10 mg  10 mg Oral QHS PRN Phineas Inches, MD   10 mg at 10/14/21  2126    Lab Results:  Results for orders placed or performed during the hospital encounter of 10/13/21 (from the past 48 hour(s))  TSH     Status: None   Collection Time: 10/15/21  6:39 AM  Result Value Ref Range   TSH 1.697 0.350 - 4.500 uIU/mL    Comment: Performed by a 3rd Generation assay with a functional sensitivity of <=0.01 uIU/mL. Performed at [facility], [address] [place], [place] [address]  Vitamin B12     Status: None   Collection Time: 10/15/21  6:39 AM  Result Value Ref Range   Vitamin B-12 184 180 - 914 pg/mL    Comment: (NOTE) This assay is not validated for testing neonatal or myeloproliferative syndrome specimens for Vitamin B12 levels. Performed at [facility], [address], [place], [place] [address]     Blood Alcohol level:  Lab Results  Component Value Date   ETH <10 10/12/2021    Metabolic Disorder Labs: No results found for: "HGBA1C", "MPG" No results found for: "PROLACTIN" No results found for: "CHOL", "TRIG", "HDL", "CHOLHDL", "VLDL", "LDLCALC"  Physical Findings: AIMS:  , ,  ,  ,    CIWA:  CIWA-Ar Total: 1 COWS:     Musculoskeletal: Strength & Muscle Tone: within normal limits Gait & Station: normal Patient leans: N/A  Psychiatric Specialty Exam:  Presentation  General Appearance: Appropriate for Environment  Eye Contact:Good  Speech:Clear and Coherent  Speech Volume:Normal  Handedness:No data recorded  Mood and Affect  Mood: "really good" Affect: euthymic  Thought Process  Thought Processes:Goal Directed  Descriptions of Associations:Intact  Orientation:Full (Time, Place and Person)  Thought Content:Logical  History of Schizophrenia/Schizoaffective disorder:No  Duration of Psychotic Symptoms:none Hallucinations:none Ideas of Reference:None  Suicidal Thoughts: denies (has denied since admission) Homicidal Thoughts: denies  Sensorium  Memory:Immediate  Good  Judgment:Fair  Insight:Fair   Executive Functions  Concentration:Good  Attention Span:Good  Recall:Good  Fund of Knowledge:Good  Language:Good   Psychomotor Activity  Psychomotor Activity:Psychomotor Activity: Normal   Assets  Assets:Communication Skills; Housing; Physical Health; Social Support   Sleep  Sleep: fair  Physical Exam Constitutional:      Appearance: the patient is not toxic-appearing.  Pulmonary:     Effort: Pulmonary effort is normal.  Neurological:     General: No focal deficit present.     Mental Status: the patient is alert and oriented to person, place, and time.   Review of Systems  Respiratory:  Negative for shortness of breath.   Cardiovascular:  Negative for chest pain.  Gastrointestinal:  Negative for abdominal pain, constipation, diarrhea, nausea and vomiting.  Neurological:  Negative for headaches.   Blood pressure (!) 144/91, pulse (!) 58, temperature (!) 97.5 F (36.4 C), temperature source Oral, resp. rate 16, height 5\' 11"  (1.803 m), weight 106.6 kg, SpO2 100 %. Body mass index is 32.78 kg/m.   Treatment Plan Summary  ASSESSMENT:  Diagnoses / Active Problems: Major depressive disorder, recurrent, severe GAD R/o Alcohol use disorder vs binge type drinking  PLAN: Safety and Monitoring:  -- Voluntary admission to inpatient psychiatric unit for safety, stabilization and treatment  -- Daily contact with patient to assess and evaluate symptoms and progress in treatment  -- Patient's case to be discussed in multi-disciplinary team meeting  -- Observation Level : q15 minute checks  -- Vital signs:  q12 hours  -- Precautions: suicide, elopement, and assault  2. Psychiatric Diagnoses and Treatment:  -- Increase Lexapro to 10 mg daily for mood -- Continue Buspar 10 mg BID -- Stop home Adderall -- Continue home Ambien at 5 mg (patient has regular fills) -- The risks/benefits/side-effects/alternatives to this medication  were discussed in detail with the patient and time was given for questions. The patient consents to medication trial.   -- Encouraged patient to participate in unit milieu and in scheduled group therapies   -- Short Term Goals: Ability to identify changes in lifestyle to reduce recurrence of  condition will improve  -- Long Term Goals: Improvement in symptoms so as ready for discharge    3. Medical Issues Being Addressed:   Tobacco Use Disorder  -- Nicotine patch 21mg /24 hours ordered  -- Smoking cessation encouraged    No need for CIWA or COWS given patient's report, but will continue    Labs             --TSH - 1.697 and B12 - 184             HLD, HTN, OSA             Home medications restarted, patient allowed to bring CPAP  4. Discharge Planning:   -- Social work and case management to assist with discharge planning and identification of hospital follow-up needs prior to discharge  -- Estimated LOS: 5-7 days  -- Discharge Concerns: Need to establish a safety plan; Medication compliance and effectiveness  -- Discharge Goals: Return home with outpatient referrals for mental health follow-up including medication management/psychotherapy -- Patient agrees to have guns locked away from him. Will need to follow up with wife.    09-22-1985, Medical Student 10/15/2021, 3:03 PM  I personally was present and performed or re-performed the history, physical exam and medical decision-making activities of this service and have verified that the service and findings are accurately documented in the student's note.  10/17/2021, MD PGY-2

## 2021-10-15 NOTE — Progress Notes (Signed)
[facility] Group Notes:  (Nursing/MHT/Case Management/Adjunct)  Date:  10/15/2021  Time:  2000 Type of Therapy:   wrap up group  Participation Level:  Active  Participation Quality:  Appropriate, Attentive, Sharing, and Supportive  Affect:  Depressed  Cognitive:  Alert  Insight:  Improving  Engagement in Group:  Engaged  Modes of Intervention:  Clarification, Education, and Support  Summary of Progress/Problems: Positive thinking and positive change were discussed.   Marcille Buffy 10/15/2021, 9:52 PM

## 2021-10-15 NOTE — Progress Notes (Signed)
   10/15/21 0500  Sleep  Number of Hours 7    

## 2021-10-15 NOTE — Progress Notes (Signed)
Pt denies SI/HI/AVH.  Pt reports that he is not feeling depressed or anxious today.  Pt is interacting well with peers on the unit.  RN will continue to provide support and assess for needs and concerns.

## 2021-10-15 NOTE — [facility] Suicide Risk Assessment (Signed)
[facility] INPATIENT:  Family/Significant Other Suicide Prevention Education  Suicide Prevention Education:  Education Completed; Jerrell Belfast, [phone]  (name of family member/significant other) has been identified by the patient as the family member/significant other with whom the patient will be residing, and identified as the person(s) who will aid the patient in the event of a mental health crisis (suicidal ideations/suicide attempt).  With written consent from the patient, the family member/significant other has been provided the following suicide prevention education, prior to the and/or following the discharge of the patient.  CSW spoke with patient wife and all guns/weapons will be secured after she gets off of work today.  She has gotten a safe and only her and her son will have access to the key.  Wife reports that for a little bit of time she plans to take guns out of house to her parents place if the gun safe is unable to be at home for tonight.  Wife believes patient has improved significantly since being here and has no additional safety concerns.  She reports ongoing issues in their relationship regarding lying and some emotional infidelity.   The suicide prevention education provided includes the following: Suicide risk factors Suicide prevention and interventions National Suicide Hotline telephone number [facility] assessment telephone number [facility] Emergency Assistance 911 [facility] and/or Residential Mobile Crisis Unit telephone number  Request made of family/significant other to: Remove weapons (e.g., guns, rifles, knives), all items previously/currently identified as safety concern.   Remove drugs/medications (over-the-counter, prescriptions, illicit drugs), all items previously/currently identified as a safety concern.  The family member/significant other verbalizes understanding of the suicide prevention education information provided.  The family  member/significant other agrees to remove the items of safety concern listed above.  Kyle Carter E Jeanne Diefendorf 10/15/2021, 1:10 PM

## 2021-10-16 NOTE — Progress Notes (Signed)
Pt has been attending groups today and ambulating on the unit, conversing with peers. Pt is alert, oriented and able to make needs known. He denies any pain at present. Denies SI/HI, denies AVH. Rates his anxiety, depression and hopelessness a 0/10. Pt is future oriented and ready to get home to his wife. Patients' goal for today is to "remember to always tell the truth." Will continue q59min checks and verbal encouragement.

## 2021-10-16 NOTE — Group Note (Signed)
LCSW Group Therapy Note  Group Date: 10/16/2021 Start Time: 1300 End Time: 1400   Type of Therapy and Topic:  Group Therapy - Healthy vs Unhealthy Coping Skills  Participation Level:  Active   Description of Group The focus of this group was to determine what unhealthy coping techniques typically are used by group members and what healthy coping techniques would be helpful in coping with various problems. Patients were guided in becoming aware of the differences between healthy and unhealthy coping techniques. Patients were asked to identify 2-3 healthy coping skills they would like to learn to use more effectively.  Therapeutic Goals Patients learned that coping is what human beings do all day long to deal with various situations in their lives Patients defined and discussed healthy vs unhealthy coping techniques Patients identified their preferred coping techniques and identified whether these were healthy or unhealthy Patients determined 2-3 healthy coping skills they would like to become more familiar with and use more often. Patients provided support and ideas to each other   Summary of Patient Progress:  During group, the Pt expressed that going outside is a coping skill for him. Patient proved open to input from peers and feedback from CSW. Patient demonstrated insight into the subject matter, was respectful of peers, and participated throughout the entire session.   Therapeutic Modalities Cognitive Behavioral Therapy Motivational Interviewing  Aram Beecham, Theresia Majors 10/16/2021  1:43 PM

## 2021-10-16 NOTE — Group Note (Signed)
Date:  10/16/2021 Time:  11:41 AM  Group Topic/Focus:  Recovery Goals:   The focus of this group is to identify appropriate goals for recovery and establish a plan to achieve them.    Participation Level:  Active  Participation Quality:  Attentive  Affect:  Appropriate  Cognitive:  Appropriate  Insight: Appropriate  Engagement in Group:  Engaged  Modes of Intervention:  Exploration    Additional Comments:   Patient was able to recall some set-backs to recovery as evidences [id number] to change and preparation and determination to change behaviors for wellness.   Reymundo Poll 10/16/2021, 11:41 AM

## 2021-10-16 NOTE — Progress Notes (Signed)

## 2021-10-16 NOTE — Plan of Care (Signed)
  Problem: Education: Goal: Utilization of techniques to improve thought processes will improve Outcome: Progressing Goal: Knowledge of the prescribed therapeutic regimen will improve Outcome: Progressing   Problem: Activity: Goal: Interest or engagement in leisure activities will improve Outcome: Progressing Goal: Imbalance in normal sleep/wake cycle will improve Outcome: Progressing   Problem: Coping: Goal: Coping ability will improve Outcome: Progressing Goal: Will verbalize feelings Outcome: Progressing   Problem: Health Behavior/Discharge Planning: Goal: Ability to make decisions will improve Outcome: Progressing Goal: Compliance with therapeutic regimen will improve Outcome: Progressing   Problem: Role Relationship: Goal: Will demonstrate positive changes in social behaviors and relationships Outcome: Progressing   Problem: Safety: Goal: Ability to disclose and discuss suicidal ideas will improve Outcome: Progressing Goal: Ability to identify and utilize support systems that promote safety will improve Outcome: Progressing   Problem: Self-Concept: Goal: Will verbalize positive feelings about self Outcome: Progressing Goal: Level of anxiety will decrease Outcome: Progressing   Problem: Education: Goal: Knowledge of [facility] Education information/materials will improve Outcome: Progressing Goal: Emotional status will improve Outcome: Progressing Goal: Mental status will improve Outcome: Progressing Goal: Verbalization of understanding the information provided will improve Outcome: Progressing   Problem: Activity: Goal: Interest or engagement in activities will improve Outcome: Progressing Goal: Sleeping patterns will improve Outcome: Progressing   Problem: Coping: Goal: Ability to verbalize frustrations and anger appropriately will improve Outcome: Progressing Goal: Ability to demonstrate self-control will improve Outcome: Progressing    Problem: Health Behavior/Discharge Planning: Goal: Identification of resources available to assist in meeting health care needs will improve Outcome: Progressing Goal: Compliance with treatment plan for underlying cause of condition will improve Outcome: Progressing   Problem: Physical Regulation: Goal: Ability to maintain clinical measurements within normal limits will improve Outcome: Progressing   Problem: Safety: Goal: Periods of time without injury will increase Outcome: Progressing   Problem: Education: Goal: Ability to make informed decisions regarding treatment will improve Outcome: Progressing   Problem: Coping: Goal: Coping ability will improve Outcome: Progressing   Problem: Health Behavior/Discharge Planning: Goal: Identification of resources available to assist in meeting health care needs will improve Outcome: Progressing   Problem: Medication: Goal: Compliance with prescribed medication regimen will improve Outcome: Progressing   Problem: Self-Concept: Goal: Ability to disclose and discuss suicidal ideas will improve Outcome: Progressing Goal: Will verbalize positive feelings about self Outcome: Progressing   

## 2021-10-16 NOTE — BHH Counselor (Signed)
CSW, provider, and Pt all spoke with the Pt's wife in a family meeting that occurred over the phone.  The Pt's wife states that she has placed all of the firearms in two safes and has placed those safes in her locked office.  She states that she works at home and is there daily.  She states that the locked boxes are also finger printed for only her finger prints to open them.  She states that she will ordering additional safes in the near future as well.  She states that she has discussed this at length with her husband and the Pt confirms that he has spoken with his wife about this plan and is in agreement with it.

## 2021-10-16 NOTE — Group Note (Signed)
Date:  10/16/2021 Time:  4:59 PM  Group Topic/Focus:  Self Care:   The focus of this group is to help patients understand the importance of self-care in order to improve or restore emotional, physical, spiritual, interpersonal, and financial health.    Participation Level:  Active  Participation Quality:  Appropriate  Affect:  Appropriate  Cognitive:  Appropriate  Insight: Appropriate  Engagement in Group:  Engaged  Modes of Intervention:  Discussion Dicussed with patient that negative emotions can seem stronger than positive emotions.  Encouraged patient to think of good times in his life in order to think positive about his life.  Reymundo Poll 10/16/2021, 4:59 PM

## 2021-10-16 NOTE — Progress Notes (Signed)
[facility] MD Progress Note  10/16/2021 3:20 PM Kyle Carter  MRN:  [id number] Subjective:   Kyle Carter is a 56 year old male with a past psychiatric history of 2 episodes of major depressive disorder who presented voluntarily for an aborted suicide attempt involving a gun.  He reports that his wife said "I am leaving you", prompting him to later take a pistol and hold it in his hands while contemplating suicide.  He texted his wife telling her that she would find him "face down in the yard" when she got home.  He presented voluntarily via the police department for assessment and evaluation.  He is admitted to the [facility] on a voluntary basis.   Yesterday, the following recommendations were made:  -- Increase Lexapro to 10 mg daily for mood  Interval History: PRN Medications administered within the last 24 hours: Ambien 10 mg for sleep Per nursing staff: In a good mood, no complaints.   Per Patient:  On assessment today, the patient reports a mood that is "good".  He reports his sleep and appetite.  He denies suicidal thoughts.  He denies homicidal thoughts or auditory or visual hallucinations.  Significant attention and time is devoted to discussing plans for his guns.  The patient reports that he and his wife have already decided that his guns will be locked in a safe in his home where he does not have access to.  Later, called and discussed with the patient and his wife with the LCSW present.  Patient's wife reports that she has obtained a gun safe where his guns will be stored.  She states that the patient will not have access to the gun safe.  She further states that the guns will be locked in her office, where the patient does not have access.  Patient denies side effects to current scheduled psychiatric medications.   Patient denies other somatic complaints.    Principal Problem: MDD (major depressive disorder), recurrent episode, severe (HCC) Diagnosis:  Principal Problem:   MDD (major depressive disorder), recurrent episode, severe (HCC)   Past Psychiatric History: 2 previous episodes episodes of Major Depressive Disorder  Past Medical History:  Past Medical History:  Diagnosis Date   ADHD    Anxiety    Depression    Hyperlipidemia    Hypertension    Sleep apnea     Past Surgical History:  Procedure Laterality Date   COLONOSCOPY W/ POLYPECTOMY     Family History:  Family History  Problem Relation Age of Onset   Diabetes Mother    Liver cancer Mother    Diabetes Father    Family Psychiatric  History: Denies   Social History:  Social History   Substance and Sexual Activity  Alcohol Use Yes   Comment: 2-3 times per wk, unsure of amount     Social History   Substance and Sexual Activity  Drug Use Not Currently    Social History   Socioeconomic History   Marital status: Married    Spouse name: Not on file   Number of children: Not on file   Years of education: Not on file   Highest education level: Not on file  Occupational History   Not on file  Tobacco Use   Smoking status: Never   Smokeless tobacco: Never  Vaping Use   Vaping Use: Never used  Substance and Sexual Activity   Alcohol use: Yes    Comment: 2-3 times per wk, unsure of amount  Drug use: Not Currently   Sexual activity: Yes  Other Topics Concern   Not on file  Social History Narrative   Not on file   Social Determinants of Health   Financial Resource Strain: Not on file  Food Insecurity: Not on file  Transportation Needs: Not on file  Physical Activity: Not on file  Stress: Not on file  Social Connections: Not on file   Additional Social History:     The patient [place] living in [place] is 20 minutes away from [place].  He reports being raised in a town near [place].  He reports living with his wife and stepson at home.  He reports working at [employer] for 2 years.  He states that he likes this job.  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  He reports having obtained high school education with no further education.  He reports that he has 2 biological children who are ages 62 and 110.  He reports that his current marriage is his second marriage.  He states that his first marriage ended due to having multiple affairs.      Sleep: Good  Appetite:  Good  Current Medications: Current Facility-Administered Medications  Medication Dose Route Frequency Provider Last Rate Last Admin   acetaminophen (TYLENOL) tablet 650 mg  650 mg Oral Q6H PRN Eligha Bridegroom, NP       alum & mag hydroxide-simeth (MAALOX/MYLANTA) 200-200-20 MG/5ML suspension 30 mL  30 mL Oral Q4H PRN Eligha Bridegroom, NP       amLODipine (NORVASC) tablet 5 mg  5 mg Oral Daily Eligha Bridegroom, NP   5 mg at 10/16/21 0803   atenolol (TENORMIN) tablet 100 mg  100 mg Oral q morning Eligha Bridegroom, NP   100 mg at 10/16/21 1047   atorvastatin (LIPITOR) tablet 10 mg  10 mg Oral q morning Eligha Bridegroom, NP   10 mg at 10/16/21 1047   busPIRone (BUSPAR) tablet 10 mg  10 mg Oral BID Eligha Bridegroom, NP   10 mg at 10/16/21 0803   escitalopram (LEXAPRO) tablet 10 mg  10 mg Oral Daily Carlyn Reichert, MD   10 mg at 10/16/21 0803   magnesium hydroxide (MILK OF MAGNESIA) suspension 30 mL  30 mL Oral Daily PRN Eligha Bridegroom, NP       traZODone (DESYREL) tablet 50 mg  50 mg Oral QHS PRN Massengill, Harrold Donath, MD       zolpidem (AMBIEN) tablet 10 mg  10 mg Oral QHS PRN Phineas Inches, MD   10 mg at 10/15/21 2036    Lab Results:  Results for orders placed or performed during the hospital encounter of 10/13/21 (from the past 48 hour(s))  TSH     Status: None   Collection Time: 10/15/21  6:39 AM  Result Value Ref Range   TSH 1.697 0.350 - 4.500 uIU/mL    Comment: Performed by a 3rd Generation assay with a functional sensitivity of <=0.01 uIU/mL. Performed at [facility], [address], [place], [place] [address]    Vitamin B12     Status: None   Collection Time: 10/15/21  6:39 AM  Result Value Ref Range   Vitamin B-12 184 180 - 914 pg/mL    Comment: (NOTE) This assay is not validated for testing neonatal or myeloproliferative syndrome specimens for Vitamin B12 levels. Performed at [facility], [address], [place], [place] [address]     Blood Alcohol level:  Lab Results  Component Value Date   ETH <10 10/12/2021    Metabolic Disorder Labs: No results found for: "HGBA1C", "MPG" No results found for: "PROLACTIN" No results found for: "CHOL", "TRIG", "HDL", "CHOLHDL", "VLDL", "LDLCALC"  Physical Findings: AIMS:  , ,  ,  ,    CIWA:  CIWA-Ar Total: 1 COWS:     Musculoskeletal: Strength & Muscle Tone: within normal limits Gait & Station: normal Patient leans: N/A  Psychiatric Specialty Exam:  Presentation  General Appearance: Appropriate for Environment  Eye Contact:Good  Speech:Clear and Coherent  Speech Volume:Normal  Handedness:No data recorded  Mood and Affect  Mood: "really good" Affect: euthymic  Thought Process  Thought Processes:Goal Directed  Descriptions of Associations:Intact  Orientation:Full (Time, Place and Person)  Thought Content:Logical  History of Schizophrenia/Schizoaffective disorder:No  Duration of Psychotic Symptoms:none Hallucinations:none Ideas of Reference:None  Suicidal Thoughts: denies (has denied since admission) Homicidal Thoughts: denies  Sensorium  Memory:Immediate Good  Judgment:Fair  Insight:Fair   Executive Functions  Concentration:Good  Attention Span:Good  Recall:Good  Fund of Knowledge:Good  Language:Good   Psychomotor Activity  Psychomotor Activity:Psychomotor Activity: Normal   Assets  Assets:Communication Skills; Housing; Physical Health; Social Support   Sleep  Sleep: fair  Physical Exam Constitutional:      Appearance: the patient is not toxic-appearing.  Pulmonary:      Effort: Pulmonary effort is normal.  Neurological:     General: No focal deficit present.     Mental Status: the patient is alert and oriented to person, place, and time.   Review of Systems  Respiratory:  Negative for shortness of breath.   Cardiovascular:  Negative for chest pain.  Gastrointestinal:  Negative for abdominal pain, constipation, diarrhea, nausea and vomiting.  Neurological:  Negative for headaches.   Blood pressure 118/68, pulse (!) 59, temperature 97.7 F (36.5 C), temperature source Oral, resp. rate 16, height 5\' 11"  (1.803 m), weight 106.6 kg, SpO2 99 %. Body mass index is 32.78 kg/m.   Treatment Plan Summary  ASSESSMENT:  Diagnoses / Active Problems: Major depressive disorder, recurrent, severe GAD R/o Alcohol use disorder vs binge type drinking  PLAN: Safety and Monitoring:  -- Voluntary admission to inpatient psychiatric unit for safety, stabilization and treatment  -- Daily contact with patient to assess and evaluate symptoms and progress in treatment  -- Patient's case to be discussed in multi-disciplinary team meeting  -- Observation Level : q15 minute checks  -- Vital signs:  q12 hours  -- Precautions: suicide, elopement, and assault  2. Psychiatric Diagnoses and Treatment:  -- Continue Lexapro 10 mg daily for mood -- Continue Buspar 10 mg BID -- Stop home Adderall -- Continue home Ambien at 5 mg (patient has regular fills) -- The risks/benefits/side-effects/alternatives to this medication were discussed in detail with the patient and time was given for questions. The patient consents to medication trial.   -- Encouraged patient to participate in unit milieu and in scheduled group therapies   -- Short Term Goals: Ability to identify changes in lifestyle to reduce recurrence of condition will improve  -- Long Term Goals: Improvement in symptoms so as ready for discharge    3. Medical Issues Being Addressed:   Tobacco Use Disorder  --  Nicotine patch 21mg /24 hours ordered  -- Smoking cessation encouraged    No need for CIWA or COWS given patient's report, but will continue    Labs             --TSH - 1.697  and B12 - 184             HLD, HTN, OSA             Home medications restarted, patient allowed to bring CPAP  4. Discharge Planning:   -- Social work and case management to assist with discharge planning and identification of hospital follow-up needs prior to discharge  -- Estimated LOS: 5-7 days  -- Discharge Concerns: Need to establish a safety plan; Medication compliance and effectiveness  -- Discharge Goals: Return home with outpatient referrals for mental health follow-up including medication management/psychotherapy -- Plan for guns upon discharge has been documented and agreed upon by patient and his wife.   Carlyn Reichert, MD 10/16/2021, 3:20 PM

## 2021-10-16 NOTE — Group Note (Signed)
Recreation Therapy Group Note   Group Topic:Problem Solving  Group Date: 10/16/2021 Start Time: 0935 End Time: 0950 Facilitators: Caroll Rancher, LRT,CTRS Location: [address] Day[address]   Goal Area(s) Addresses:  Patient will effectively work with peer towards shared goal.  Patient will identify skills used to make activity successful.  Patient will identify how skills used during activity can be used to reach post d/c goals.    Group Description:  Landing Pad. In teams of 3-5, patients were given 12 plastic drinking straws and an equal length of masking tape. Using the materials provided, patients were asked to build a landing pad to catch a golf ball dropped from approximately 5 feet in the air. All materials were required to be used by the team in their design. LRT facilitated post-activity discussion.   Affect/Mood: Appropriate   Participation Level: Engaged   Participation Quality: Independent   Behavior: Appropriate   Speech/Thought Process: Focused   Insight: Good   Judgement: Good   Modes of Intervention: STEM Activity   Patient Response to Interventions:  Engaged   Education Outcome:  Acknowledges education and In group clarification offered    Clinical Observations/Individualized Feedback: Pt was engaged with peers and active with activity throughout group.    Plan: Continue to engage patient in RT group sessions 2-3x/week.   Caroll Rancher, LRT,CTRS 10/16/2021 12:40 PM

## 2021-10-17 NOTE — B[facility] Group Notes (Signed)
[facility] Group Notes:  (Nursing/MHT/Case Management/Adjunct)  Date:  10/17/2021  Time:  9:34 AM  Type of Therapy:   Orientation/ Goals Group  Participation Level:  Active  Participation Quality:  Attentive  Affect:  Appropriate  Cognitive:  Appropriate  Insight:  Good  Engagement in Group:  Engaged  Modes of Intervention:  Discussion  Summary of Progress/Problems: Pt was engaged, and participated in group.  Fatima Blank 10/17/2021, 9:34 AM

## 2021-10-17 NOTE — Progress Notes (Addendum)
[facility] MD Progress Note  10/17/2021 7:21 AM Kyle Carter  MRN:  [id number] Subjective:   Kyle Carter is a 56 year old male with a past psychiatric history of 2 episodes of major depressive disorder who presented voluntarily due to an aborted suicide attempt involving a gun.    Interval History: PRN Medications administered within the last 24 hours: Ambien 10 mg for sleep Per nursing staff: In a good mood, no complaints.  Per Patient:  On assessment today, the patient reports a mood that is "good".  Reports wife has been regularly visiting him and she reports that he is doing much better daily which makes him feel good.  States that wife is arranging for marriage counseling.  Denies SI/HI/AVH  Patient denies side effects to current scheduled psychiatric medications.   Patient denies other somatic complaints.  Amenable to plan discharge tomorrow.  Principal Problem: MDD (major depressive disorder), recurrent episode, severe (HCC) Diagnosis: Principal Problem:   MDD (major depressive disorder), recurrent episode, severe (HCC)   Past Psychiatric History: 2 previous episodes episodes of Major Depressive Disorder  Past Medical History:  Past Medical History:  Diagnosis Date   ADHD    Anxiety    Depression    Hyperlipidemia    Hypertension    Sleep apnea     Past Surgical History:  Procedure Laterality Date   COLONOSCOPY W/ POLYPECTOMY     Family History:  Family History  Problem Relation Age of Onset   Diabetes Mother    Liver cancer Mother    Diabetes Father    Family Psychiatric  History: Denies   Social History:  Social History   Substance and Sexual Activity  Alcohol Use Yes   Comment: 2-3 times per wk, unsure of amount     Social History   Substance and Sexual Activity  Drug Use Not Currently    Social History   Socioeconomic History   Marital status: Married    Spouse name: Not on file   Number of children: Not on file   Years of education: Not  on file   Highest education level: Not on file  Occupational History   Not on file  Tobacco Use   Smoking status: Never   Smokeless tobacco: Never  Vaping Use   Vaping Use: Never used  Substance and Sexual Activity   Alcohol use: Yes    Comment: 2-3 times per wk, unsure of amount   Drug use: Not Currently   Sexual activity: Yes  Other Topics Concern   Not on file  Social History Narrative   Not on file   Social Determinants of Health   Financial Resource Strain: Not on file  Food Insecurity: Not on file  Transportation Needs: Not on file  Physical Activity: Not on file  Stress: Not on file  Social Connections: Not on file   Additional Social History:     The patient [place] minutes away from [place].  He reports being raised in a town near [place].  He reports living with his wife and stepson at home.  He reports working at [employer] for 2 years.  He states that he likes this job.  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  He reports having obtained high school education with no further education.  He reports that he has 2 biological children who are ages 34 and 43.  He reports that his current marriage is his second marriage.  He  states that his first marriage ended due to having multiple affairs.      Sleep: Good  Appetite:  Good  Current Medications: Current Facility-Administered Medications  Medication Dose Route Frequency Provider Last Rate Last Admin   acetaminophen (TYLENOL) tablet 650 mg  650 mg Oral Q6H PRN Eligha Bridegroom, NP       alum & mag hydroxide-simeth (MAALOX/MYLANTA) 200-200-20 MG/5ML suspension 30 mL  30 mL Oral Q4H PRN Eligha Bridegroom, NP       amLODipine (NORVASC) tablet 5 mg  5 mg Oral Daily Eligha Bridegroom, NP   5 mg at 10/16/21 0803   atenolol (TENORMIN) tablet 100 mg  100 mg Oral q morning Eligha Bridegroom, NP   100 mg at 10/16/21 1047   atorvastatin (LIPITOR) tablet 10 mg  10 mg Oral q morning  Eligha Bridegroom, NP   10 mg at 10/16/21 1047   busPIRone (BUSPAR) tablet 10 mg  10 mg Oral BID Eligha Bridegroom, NP   10 mg at 10/16/21 1640   escitalopram (LEXAPRO) tablet 10 mg  10 mg Oral Daily Carlyn Reichert, MD   10 mg at 10/16/21 0803   magnesium hydroxide (MILK OF MAGNESIA) suspension 30 mL  30 mL Oral Daily PRN Eligha Bridegroom, NP       traZODone (DESYREL) tablet 50 mg  50 mg Oral QHS PRN Massengill, Harrold Donath, MD       zolpidem (AMBIEN) tablet 10 mg  10 mg Oral QHS PRN Phineas Inches, MD   10 mg at 10/16/21 2114    Lab Results:  No results found for this or any previous visit (from the past 48 hour(s)).   Blood Alcohol level:  Lab Results  Component Value Date   ETH <10 10/12/2021    Metabolic Disorder Labs: No results found for: "HGBA1C", "MPG" No results found for: "PROLACTIN" No results found for: "CHOL", "TRIG", "HDL", "CHOLHDL", "VLDL", "LDLCALC"  Physical Findings: AIMS:  , ,  ,  ,    CIWA:  CIWA-Ar Total: 1 COWS:     Musculoskeletal: Strength & Muscle Tone: within normal limits Gait & Station: normal Patient leans: N/A  Psychiatric Specialty Exam:  Presentation  General Appearance: Appropriate for Environment  Eye Contact:Good  Speech:Clear and Coherent  Speech Volume:Normal  Handedness:No data recorded  Mood and Affect  Mood: "really good" Affect: euthymic  Thought Process  Thought Processes:Goal Directed  Descriptions of Associations:Intact  Orientation:Full (Time, Place and Person)  Thought Content:Logical  History of Schizophrenia/Schizoaffective disorder:No  Duration of Psychotic Symptoms:none Hallucinations:none Ideas of Reference:None  Suicidal Thoughts: denies (has denied since admission) Homicidal Thoughts: denies  Sensorium  Memory:Immediate Good  Judgment:Fair  Insight:Fair   Executive Functions  Concentration:Good  Attention Span:Good  Recall:Good  Fund of  Knowledge:Good  Language:Good   Psychomotor Activity  Psychomotor Activity:No data recorded   Assets  Assets:Communication Skills; Housing; Physical Health; Social Support   Sleep  Sleep: fair  Physical Exam Constitutional:      Appearance: the patient is not toxic-appearing.  Pulmonary:     Effort: Pulmonary effort is normal.  Neurological:     General: No focal deficit present.     Mental Status: the patient is alert and oriented to person, place, and time.   Review of Systems  Respiratory:  Negative for shortness of breath.   Cardiovascular:  Negative for chest pain.  Gastrointestinal:  Negative for abdominal pain, constipation, diarrhea, nausea and vomiting.  Neurological:  Negative for headaches.   Blood pressure 121/79, pulse (!) 56,  temperature 97.6 F (36.4 C), temperature source Oral, resp. rate 20, height 5\' 11"  (1.803 m), weight 106.6 kg, SpO2 99 %. Body mass index is 32.78 kg/m.   Treatment Plan Summary  ASSESSMENT:  Diagnoses / Active Problems:   PLAN: Safety and Monitoring:  -- Voluntary admission to inpatient psychiatric unit for safety, stabilization and treatment  -- Daily contact with patient to assess and evaluate symptoms and progress in treatment  -- Patient's case to be discussed in multi-disciplinary team meeting  -- Observation Level : q15 minute checks  -- Vital signs:  q12 hours  -- Precautions: suicide, elopement, and assault  2. Psychiatric Diagnoses and Treatment:  Major depressive disorder, recurrent, severe GAD R/o Alcohol use disorder vs binge type drinking -- Continue Lexapro 10 mg daily for mood -- Continue Buspar 10 mg BID -- Continue home Ambien at 5 mg (patient has regular fills)    3. Medical Issues Being Addressed:   Tobacco Use Disorder  -- Nicotine patch 21mg /24 hours ordered  -- Smoking cessation encouraged   4. Discharge Planning:   -- Social work and case management to assist with discharge planning and  identification of hospital follow-up needs prior to discharge  -- Estimated LOS: 5-7 days  -- Discharge Concerns: Need to establish a safety plan; Medication compliance and effectiveness  -- Discharge Goals: Return home with outpatient referrals for mental health follow-up including medication management/psychotherapy  -- Safety planning regarding firearms have been arranged with patient's wife  -- Plan for discharge tomorrow  , MD 10/17/2021, 7:21 AM

## 2021-10-17 NOTE — Progress Notes (Signed)
Pt states he slept good last night. Pt denies SI/HI/AVH. Pt states "My wife lock the guns with a fingerprint scan". Pt was pleasant on approach. Pt remains safe.

## 2021-10-17 NOTE — Plan of Care (Signed)
Patient is active in the milieu. Currently attending group. Pleasant and cooperative. Alert and oriented x 4. Denying thoughts of self-harm. Denying hallucinations. Has no sign of distress.

## 2021-10-17 NOTE — Group Note (Signed)
LCSW Group Therapy Note  10/17/2021   10:30-11:30am   Topic:  Anger and its Underlying Emotions  Participation Level:  Active  Description of Group:   In this group, patients identified the primary emotions they often have in situations that eventually provoke them to anger.  Focus was placed on how helpful it is to recognize the underlying emotions to anger in order to address these for more permanent resolution.  Emphasis was also on identifying possible replacement thoughts for the automatic thoughts generated in various situations shared by the group.  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  He said there is always somebody finding fault and most days he can "take it" but when he is already having a hard day, it makes it that much worse.  The patient also said he has a lot of different hats that he wears including husband, father, and managing a lot of different types of sports games such as baseball, volleyball, and such.  Patient indicated a willingness to try working on the underlying emotions in order to feel better both in the short-term and in the long-run.  Therapeutic Modalities:   Cognitive Behavioral Therapy Processing  Lynnell Chad, MSW, LCSW

## 2021-10-17 NOTE — Progress Notes (Signed)
Adult Psychoeducational Group Note  Date:  10/17/2021 Time:  8:37 PM  Group Topic/Focus:  Wrap-Up Group:   The focus of this group is to help patients review their daily goal of treatment and discuss progress on daily workbooks.  Participation Level:  Active  Participation Quality:  Appropriate and Attentive  Affect:  Appropriate  Cognitive:  Alert and Appropriate  Insight: Appropriate  Engagement in Group:  Engaged  Modes of Intervention:  Discussion  Additional Comments:   Pt states he has had a good day and states that has been anxious but excited about his D/C tomorrow. Pt states that one of his goals is to try out therapy services.   Vevelyn Pat 10/17/2021, 8:37 PM

## 2021-10-18 MED ORDER — ESCITALOPRAM OXALATE 10 MG PO TABS: 10.0000 mg | ORAL_TABLET | Freq: Every day | ORAL | 0 refills | Status: AC

## 2021-10-18 NOTE — BHH Suicide Risk Assessment (Cosign Needed)
[facility] Discharge Suicide Risk Assessment   Principal Problem: MDD (major depressive disorder), recurrent episode, severe (HCC) Discharge Diagnoses: Principal Problem:   MDD (major depressive disorder), recurrent episode, severe (HCC)   Reason for Admission: Suicidal ideation  Hospital Summary During the patient's hospitalization, patient had extensive initial psychiatric evaluation, and follow-up psychiatric evaluations every day.  Psychiatric diagnoses provided upon initial assessment: Major depressive disorder, generalized anxiety disorder, alcohol use disorder  Patient's psychiatric medications were adjusted on admission: Switched from Prozac to Lexapro 10 mg daily, continue BuSpar 10 mg twice daily, continue Ambien 5 mg  During the hospitalization, other adjustments were made to the patient's psychiatric medication regimen: None  Gradually, patient started adjusting to milieu.   Patient's care was discussed during the interdisciplinary team meeting every day during the hospitalization.  The patient denies having side effects to prescribed psychiatric medication.  The patient reports their target psychiatric symptoms of depression, anxiety, suicidal ideation responded well to the psychiatric medications, and the patient reports overall benefit other psychiatric hospitalization. Supportive psychotherapy was provided to the patient. The patient also participated in regular group therapy while admitted.   Labs were reviewed with the patient, and abnormal results were discussed with the patient.  The patient denied having suicidal thoughts more than 48 hours prior to discharge.  Patient denies having homicidal thoughts.  Patient denies having auditory hallucinations.  Patient denies any visual hallucinations.  Patient denies having paranoid thoughts.  The patient is able to verbalize their individual safety plan to this provider.  It is recommended to the patient to continue psychiatric  medications as prescribed, after discharge from the hospital.    It is recommended to the patient to follow up with your outpatient psychiatric provider and PCP.  Discussed with the patient, the impact of alcohol, drugs, tobacco have been there overall psychiatric and medical wellbeing, and total abstinence from substance use was recommended the patient.   Total Time spent with patient: 30 minutes  Musculoskeletal: Strength & Muscle Tone: within normal limits Gait & Station: normal Patient leans: N/A  Psychiatric Specialty Exam  Presentation  General Appearance: Appropriate for Environment   Eye Contact:Good   Speech:Clear and Coherent   Speech Volume:Normal   Handedness:No data recorded   Mood and Affect  Mood:-- ("really good")   Duration of Depression Symptoms: Less than two weeks   Affect:Congruent    Thought Process  Thought Processes:Goal Directed   Descriptions of Associations:Intact   Orientation:Full (Time, Place and Person)   Thought Content:Logical   History of Schizophrenia/Schizoaffective disorder:No   Duration of Psychotic Symptoms:No data recorded  Hallucinations:No data recorded Ideas of Reference:None   Suicidal Thoughts:No data recorded Homicidal Thoughts:No data recorded  Sensorium  Memory:Immediate Good   Judgment:Fair   Insight:Fair    Executive Functions  Concentration:Good   Attention Span:Good   Recall:Good   Fund of Knowledge:Good   Language:Good    Psychomotor Activity  Psychomotor Activity:No data recorded  Assets  Assets:Communication Skills; Housing; Physical Health; Social Support    Sleep  Sleep:No data recorded  Physical Exam: Physical Exam Vitals and nursing note reviewed.  Constitutional:      Appearance: Normal appearance. He is normal weight.  HENT:     Head: Normocephalic and atraumatic.  Pulmonary:     Effort: Pulmonary effort is normal.  Neurological:      General: No focal deficit present.     Mental Status: He is oriented to person, place, and time.    Review of Systems  Respiratory:  Negative for shortness of breath.   Cardiovascular:  Negative for chest pain.  Gastrointestinal:  Negative for abdominal pain, constipation, diarrhea, heartburn, nausea and vomiting.  Neurological:  Negative for headaches.  Psychiatric/Behavioral: Negative.  Negative for depression, hallucinations, memory loss, substance abuse and suicidal ideas. The patient is not nervous/anxious and does not have insomnia.    Blood pressure 120/83, pulse (!) 56, temperature 97.7 F (36.5 C), temperature source Oral, resp. rate 20, height 5\' 11"  (1.803 m), weight 106.6 kg, SpO2 100 %. Body mass index is 32.78 kg/m.  Mental Status Per Nursing Assessment::   On Admission:  Suicidal ideation indicated by patient, Suicidal ideation indicated by others, Self-harm behaviors  Demographic Factors:  Male  Loss Factors: NA  Historical Factors: NA  Risk Reduction Factors:   Positive coping skills or problem solving skills  Continued Clinical Symptoms:  Severe Anxiety and/or Agitation More than one psychiatric diagnosis Medical Diagnoses and Treatments/Surgeries  Cognitive Features That Contribute To Risk:  None    Suicide Risk:  Mild:  Suicidal ideation of limited frequency, intensity, duration, and specificity.  There are no identifiable plans, no associated intent, mild dysphoria and related symptoms, good self-control (both objective and subjective assessment), few other risk factors, and identifiable protective factors, including available and accessible social support.   Follow-up Information     [phone] Follow up on 10/26/2021.   Why: You have an appointment for therapy services on 10/26/21 at 12:00 pm.   This appointment will be Virtual, but may be switched to in person. Contact information: [address] [place], [place] [phone]         [facility] Follow up on 11/10/2021.   Why: You have an appointment for medication management services on 11/10/21 at 6:30 pm.   This will be a VIRTUAL appointment (but may be changed to in person). Contact information: [address] [place] [phone]         Support groups. Go to.   Why: Please go to this website to access support groups for porn addiction/sexual compulsion. Contact information: [phone]                Plan Of Care/Follow-up recommendations:  Activity: as tolerated  Diet: heart healthy  Other: -Follow-up with your outpatient psychiatric provider -instructions on appointment date, time, and address (location) are provided to you in discharge paperwork.  -Take your psychiatric medications as prescribed at discharge - instructions are provided to you in the discharge paperwork  -Follow-up with outpatient primary care doctor and other specialists -for management of chronic medical disease, including: Alcohol use (consider naltrexone if having problems with alcohol dependence)  -Testing: Follow-up with outpatient provider for abnormal lab results: none  -Recommend abstinence from alcohol, tobacco, and other illicit drug use at discharge.   -If your psychiatric symptoms recur, worsen, or if you have side effects to your psychiatric medications, call your outpatient psychiatric provider, 911, [phone] or go to the nearest emergency department.  -If suicidal thoughts recur, call your outpatient psychiatric provider, 911, [phone] or go to the nearest emergency department.   [email or web address], MD 10/18/2021, 7:21 AM

## 2021-10-18 NOTE — Group Note (Signed)
Date:  10/18/2021 Time:  10:40 AM  Group Topic/Focus:  Orientation:   The focus of this group is to educate the patient on the purpose and policies of crisis stabilization and provide a format to answer questions about their admission.  The group details unit policies and expectations of patients while admitted.    Participation Level:  Active  Participation Quality:  Appropriate  Affect:  Appropriate  Cognitive:  Appropriate  Insight: Appropriate  Engagement in Group:  Engaged  Modes of Intervention:  Discussion  Additional Comments:     Reymundo Poll 10/18/2021, 10:40 AM

## 2021-10-18 NOTE — Progress Notes (Signed)
RN met with pt and reviewed pt's discharge instructions. Pt verbalized understanding of discharge instructions and pt did not have any questions. RN reviewed and provided pt with a copy of SRA, AVS and Transition Record. RN returned pt's belongings to pt, including pt's cpap machine.  Pt denied SI/HI/AVH and voiced no concerns. Pt was appreciative of the care pt received at [facility]. Patient discharged to the lobby without incident.

## 2021-10-18 NOTE — Discharge Summary (Signed)
Physician Discharge Summary Note  Patient:  Kyle Carter is an 56 y.o., male MRN:  [id number] DOB:  13-Mar-1966 Patient phone:  [phone] (home)  Patient address:   [address] [place] [address],  Total Time spent with patient: 45 minutes  Date of Admission:  10/13/2021 Date of Discharge: 10/18/21  Reason for Admission:  Kyle Carter is a 56 year old male with a past psychiatric history of 2 episodes of major depressive disorder who presented voluntarily due to an aborted suicide attempt involving a gun.    Principal Problem: MDD (major depressive disorder), recurrent episode, severe (HCC) Discharge Diagnoses: Principal Problem:   MDD (major depressive disorder), recurrent episode, severe (HCC)  Past Medical History:  Past Medical History:  Diagnosis Date   ADHD    Anxiety    Depression    Hyperlipidemia    Hypertension    Sleep apnea     Past Surgical History:  Procedure Laterality Date   COLONOSCOPY W/ POLYPECTOMY     Family History:  Family History  Problem Relation Age of Onset   Diabetes Mother    Liver cancer Mother    Diabetes Father    Family Psychiatric  History: Denies Social History:  Social History   Substance and Sexual Activity  Alcohol Use Yes   Comment: 2-3 times per wk, unsure of amount     Social History   Substance and Sexual Activity  Drug Use Not Currently    Social History   Socioeconomic History   Marital status: Married    Spouse name: Not on file   Number of children: Not on file   Years of education: Not on file   Highest education level: Not on file  Occupational History   Not on file  Tobacco Use   Smoking status: Never   Smokeless tobacco: Never  Vaping Use   Vaping Use: Never used  Substance and Sexual Activity   Alcohol use: Yes    Comment: 2-3 times per wk, unsure of amount   Drug use: Not Currently   Sexual activity: Yes  Other Topics Concern   Not on file  Social History Narrative    Not on file   Social Determinants of Health   Financial Resource Strain: Not on file  Food Insecurity: Not on file  Transportation Needs: Not on file  Physical Activity: Not on file  Stress: Not on file  Social Connections: Not on file    Hospital Course:   During the patient's hospitalization, patient had extensive initial psychiatric evaluation, and follow-up psychiatric evaluations every day.   Psychiatric diagnoses provided upon initial assessment: Major depressive disorder, generalized anxiety disorder, alcohol use disorder   Patient's psychiatric medications were adjusted on admission: Switched from Prozac to Lexapro 10 mg daily, continue BuSpar 10 mg twice daily, continue Ambien 5 mg   During the hospitalization, other adjustments were made to the patient's psychiatric medication regimen: None   Gradually, patient started adjusting to milieu.   Patient's care was discussed during the interdisciplinary team meeting every day during the hospitalization.   The patient denies having side effects to prescribed psychiatric medication.   The patient reports their target psychiatric symptoms of depression, anxiety, suicidal ideation responded well to the psychiatric medications, and the patient reports overall benefit other psychiatric hospitalization. Supportive psychotherapy was provided to the patient. The patient also participated in regular group therapy while admitted.    Labs were reviewed with the patient, and abnormal results were discussed with  the patient.   The patient denied having suicidal thoughts more than 48 hours prior to discharge.  Patient denies having homicidal thoughts.  Patient denies having auditory hallucinations.  Patient denies any visual hallucinations.  Patient denies having paranoid thoughts.   The patient is able to verbalize their individual safety plan to this provider.   It is recommended to the patient to continue psychiatric medications as  prescribed, after discharge from the hospital.     It is recommended to the patient to follow up with your outpatient psychiatric provider and PCP.   Discussed with the patient, the impact of alcohol, drugs, tobacco have been there overall psychiatric and medical wellbeing, and total abstinence from substance use was recommended the patient.    Physical Findings: CIWA:  CIWA-Ar Total: 1  Musculoskeletal: Strength & Muscle Tone: within normal limits Gait & Station: normal Patient leans: N/A   Psychiatric Specialty Exam:  Presentation  General Appearance: Appropriate for Environment   Eye Contact:Good   Speech:Clear and Coherent   Speech Volume:Normal   Handedness:No data recorded   Mood and Affect  Mood:-- ("really good")   Affect:Congruent    Thought Process  Thought Processes:Goal Directed   Descriptions of Associations:Intact   Orientation:Full (Time, Place and Person)   Thought Content:Logical   History of Schizophrenia/Schizoaffective disorder:No   Duration of Psychotic Symptoms:No data recorded  Hallucinations:No data recorded  Ideas of Reference:None   Suicidal Thoughts:No data recorded  Homicidal Thoughts:No data recorded   Sensorium  Memory:Immediate Good   Judgment:Fair   Insight:Fair    Executive Functions  Concentration:Good   Attention Span:Good   [place] of Knowledge:Good   Language:Good    Psychomotor Activity  Psychomotor Activity:No data recorded   Assets  Assets:Communication Skills; Housing; Physical Health; Social Support    Sleep  Sleep:No data recorded    Physical Exam: Physical Exam Vitals and nursing note reviewed.  Constitutional:      Appearance: Normal appearance. He is normal weight.  HENT:     Head: Normocephalic and atraumatic.  Pulmonary:     Effort: Pulmonary effort is normal.  Neurological:     General: No focal deficit present.     Mental Status: He is  oriented to person, place, and time.    Review of Systems  Respiratory:  Negative for shortness of breath.   Cardiovascular:  Negative for chest pain.  Gastrointestinal:  Negative for abdominal pain, constipation, diarrhea, heartburn, nausea and vomiting.  Neurological:  Negative for headaches.  Psychiatric/Behavioral: Negative.  Negative for depression, hallucinations, memory loss, substance abuse and suicidal ideas. The patient is not nervous/anxious and does not have insomnia.    Blood pressure 120/83, pulse (!) 56, temperature 97.7 F (36.5 C), temperature source Oral, resp. rate 20, height 5\' 11"  (1.803 m), weight 106.6 kg, SpO2 100 %. Body mass index is 32.78 kg/m.   Social History   Tobacco Use  Smoking Status Never  Smokeless Tobacco Never   Tobacco Cessation:  N/A, patient does not currently use tobacco products   Blood Alcohol level:  Lab Results  Component Value Date   ETH <10 0000000    Metabolic Disorder Labs:  No results found for: "HGBA1C", "MPG" No results found for: "PROLACTIN" No results found for: "CHOL", "TRIG", "HDL", "CHOLHDL", "VLDL", "[place]"  See Psychiatric Specialty Exam and Suicide Risk Assessment completed by Attending Physician prior to discharge.  Discharge destination:  Home  Is patient on multiple antipsychotic therapies at discharge:  No  Has Patient had three or more failed trials of antipsychotic monotherapy by history:  No  Recommended Plan for Multiple Antipsychotic Therapies: NA  Discharge Instructions     Call MD for:  difficulty breathing, headache or visual disturbances   Complete by: As directed    Call MD for:  hives   Complete by: As directed    Call MD for:  persistant nausea and vomiting   Complete by: As directed    Call MD for:  redness, tenderness, or signs of infection (pain, swelling, redness, odor or green/yellow discharge around incision site)   Complete by: As directed    Call MD for:  severe  uncontrolled pain   Complete by: As directed    Diet - low sodium heart healthy   Complete by: As directed    Discharge instructions   Complete by: As directed    Dear Mr. Konicki,  It was a pleasure taking care of you while you are in the hospital.  You were admitted due to suicidal ideations and depression.  We switched your Prozac to Lexapro and discontinued her Adderall but have made no other changes to your medications at this time.  Please follow-up with your outpatient psychiatrist for further medication management.  Report any adverse effects and or reactions from the medicines to your outpatient provider promptly. Do not engage in alcohol and or illegal drug use while on prescription medicines. In the event of worsening symptoms, call the crisis hotline, 911 and or go to the nearest ED for appropriate evaluation and treatment of symptoms. Follow-up with your primary care provider for your other medical issues, concerns and or health care needs.  Take Care! -Dr. Lurline Hare   Increase activity slowly   Complete by: As directed       Allergies as of 10/18/2021   No Known Allergies      Medication List     STOP taking these medications    amphetamine-dextroamphetamine 30 MG tablet Commonly known as: ADDERALL   FLUoxetine 40 MG capsule Commonly known as: PROZAC       TAKE these medications      Indication  amLODipine 5 MG tablet Commonly known as: NORVASC Take 5 mg by mouth in the morning.  Indication: High Blood Pressure Disorder   atenolol 100 MG tablet Commonly known as: TENORMIN Take 100 mg by mouth every morning.  Indication: High Blood Pressure Disorder   atorvastatin 10 MG tablet Commonly known as: LIPITOR Take 10 mg by mouth every morning.  Indication: High Amount of Fats in the Blood   busPIRone 10 MG tablet Commonly known as: BUSPAR Take 10 mg by mouth 2 (two) times daily.  Indication: Anxiety Disorder   escitalopram 10 MG tablet Commonly known  as: LEXAPRO Take 1 tablet (10 mg total) by mouth daily.  Indication: Major Depressive Disorder   sildenafil 100 MG tablet Commonly known as: VIAGRA Take 100 mg by mouth daily as needed for erectile dysfunction.  Indication: Erectile Dysfunction   zolpidem 10 MG tablet Commonly known as: AMBIEN Take 10 mg by mouth at bedtime as needed for sleep.  Indication: [facility] Follow up on 10/26/2021.   Why: You have an appointment for therapy services on 10/26/21 at 12:00 pm.   This appointment will be Virtual, but may be switched to in person. Contact information: [place]  [address] [phone]         [facility] Follow up on 11/10/2021.   Why: You have an appointment for medication management services on 11/10/21 at 6:30 pm.   This will be a VIRTUAL appointment (but may be changed to in person). Contact information: [address] [place] [address] [phone]         Support groups. Go to.   Why: Please go to this website to access support groups for porn addiction/sexual compulsion. Contact information: [email or web address]                Follow-up recommendations:   Take all medications as prescribed by his/her mental healthcare provider. Report any adverse effects and or reactions from the medicines to your outpatient provider promptly. Do not engage in alcohol and or illegal drug use while on prescription medicines. In the event of worsening symptoms, call the crisis hotline, 911 and or go to the nearest ED for appropriate evaluation and treatment of symptoms. follow-up with your primary care provider for your other medical issues, concerns and or health care needs.   Signed: Park Pope, MD 10/18/2021, 9:48 AM

## 2021-10-18 NOTE — Group Note (Signed)
[facility] LCSW Group Therapy Note  Date/Time:  10/18/2021 10:00am-11:00am  Type of Therapy and Topic:  Group Therapy:  Healthy and Unhealthy Supports  Participation Level:  Active   Description of Group:  Patients in this group were introduced to the idea of adding a variety of healthy supports to address the various needs in their lives.  Patients discussed what additional healthy supports could be helpful in their recovery and wellness after discharge in order to prevent future hospitalizations such as counselor, doctor, other levels of psychiatric care such as ACTT services, therapy groups, 12-step groups, and problem-specific support groups.  A demonstration was given about how to set boundaries which patients expressed was beneficial.  Several songs were played to inspire patients to be more self-supportive.  Therapeutic Goals:   1)  discuss importance of adding supports to stay well once out of the hospital  2)  compare healthy versus unhealthy supports and identify some examples of each  3)  generate ideas and descriptions of healthy supports that can be added  4)  offer mutual support about how to address unhealthy supports  5)  encourage active participation in and adherence to discharge plan    Summary of Patient Progress:  The patient stated that current healthy supports in his life are his wife, stepson, mother-in-law, father-in-law, 3 grandbabies, and his animals.  The patient expressed himself little in group but was very attentive and sang along with some of the songs.   Therapeutic Modalities:   Motivational Interviewing Brief Solution-Focused Therapy  Ambrose Mantle, LCSW

## 2021-10-18 NOTE — Progress Notes (Signed)
  [facility] Adult Case Management Discharge Plan :  Will you be returning to the same living situation after discharge:  Yes,  with wife and stepson At discharge, do you have transportation home?: Yes,  family Do you have the ability to pay for your medications: Yes,  insurance  Release of information consent forms completed and emailed to Medical Records, then turned in to Medical Records by CSW.   Patient to Follow up at:  Follow-up Information     [facility] Follow up on 10/26/2021.   Why: You have an appointment for therapy services on 10/26/21 at 12:00 pm.   This appointment will be Virtual, but may be switched to in person. Contact information: [address] [place], [place] [address] [phone]         [facility] Follow up on 11/10/2021.   Why: You have an appointment for medication management services on 11/10/21 at 6:30 pm.   This will be a VIRTUAL appointment (but may be changed to in person). Contact information: [address] [place] [address] [phone]         Support groups. Go to.   Why: Please go to this website to access support groups for porn addiction/sexual compulsion. Contact information: [email or web address]                Next level of care provider has access to [facility] Link:no  Safety Planning and Suicide Prevention discussed: Yes,  with wife     Has patient been referred to the Quitline?: N/A patient is not a smoker  Patient has been referred for addiction treatment: Yes  Lynnell Chad, LCSW 10/18/2021, 8:57 AM

## 2021-10-21 ENCOUNTER — Encounter: Payer: Self-pay | Admitting: Neurology

## 2021-10-21 ENCOUNTER — Other Ambulatory Visit: Payer: Self-pay | Admitting: Neurology

## 2021-10-21 NOTE — Telephone Encounter (Signed)
Pt is asking if Dr Vickey Huger would take over the Adderall prescription. Pt has

## 2021-10-23 MED ORDER — AMPHETAMINE-DEXTROAMPHETAMINE 10 MG PO TABS: 10.0000 mg | ORAL_TABLET | Freq: Three times a day (TID) | ORAL | 0 refills | Status: DC | PRN

## 2021-12-14 ENCOUNTER — Other Ambulatory Visit: Payer: Self-pay | Admitting: Neurology

## 2021-12-21 NOTE — Progress Notes (Signed)
[name] [address] [place]. [place] [address] [phone]       OFFICE FOLLOW UP NOTE  [name] Date of Birth:  05-31-1965 Medical Record Number:  [id number]   Reason for visit: Initial CPAP follow-up    SUBJECTIVE:   CHIEF COMPLAINT:  No chief complaint on file.   HPI:   Update 12/22/2021 JM: Patient returns for initial CPAP compliance visit.  Completed CPAP titration 01/2021 which showed moderate to severe OSA         Consult visit 11/20/2020 Dr. Brett Carter: Kyle Carter is a 56 y.o. year old White or Caucasian male patient seen here as a referral on 11/20/2020 from PCP for a sleep consultation:. he patient has been using CPAPfor over almost decade, last sleep study in 09-2017, at Dr. Molli Carter , [facility].   and last new CPAP issued in the last year 2021.  The patient's wife reports him snoring through the CPAP.    The patient lives in [place] and he has had issues with snoring and residual apnea on his current CPAP.  Again the CPAP is a rather new model.  And I have some compliance download here 1 over the last 90 days which shows a 78% compliance.  The compliance has become more spotty as he is less happy with his CPAP use.  He is actually using at BiPAP with an incoming pressure of 18 and a expiratory pressure setting of 14 cmH2O is easy breeze function on.  The residual AHI is 26.6/h so what this basically means is that the machine is not working for him.  He has a severe residual apnea by using it.  Most of the events still seem to be obstructive in nature but there are also some central apneas arising.  The air leak at median is 16.2 L and that should not be the cause of the high residual apnea.  Minute ventilation and tidal volume have been in the same range for the last 90 days respiratory rate is not wearing very much. The patient presented with excessive daytime sleepiness reflected and an Epworth sleepiness  score of 18 points. There was a report of a dry mouth in the morning witnessed lo ud snoring and witnessed apneas by his spouse. The patient's regular sleep routine is as follows; the patient normally goes to bed around 11 and falls asleep within 10-15 minutes he is awoken 3 times at night and nocturia and is woken by his alarm clock at 6 in the morning. 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  This functional set up he also has increased daytime sleepiness and currently endorsed the Epworth sleepiness score at 17 points.   He uses a dream wear FFM, and has facial hair.  He has never used any other mask that was not a FFM, he has slept with his mouth wide open, dry , may ned a chin strap.          I have the pleasure of seeing Kyle Carter today, a right-handed White or Caucasian male with OSA untreated on BiPAP  sleep disorder who  has a past medical history of ADHD, Anxiety, Depression, Hyperlipidemia, Hypertension, and Sleep apnea.     Sleep relevant medical history: Nocturia- 4-5 ,  no cervical spine surgery,  deviated septum with left sided restricted airflow.     Family medical /sleep history: one brother on CPAP with OSA.    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  The patient currently works/ used to work in shifts(early,)   Tobacco use/.  ETOH use 2/ night ,  Caffeine intake in form of Tea ( 2 a day) or energy drinks. Regular exercise in form of physical work .       Sleep habits are as follows: The patient's dinner time is between 6-7 PM. The patient goes to bed at 8.30 PM and continues to sleep for 4-5 hours, wakes for many, many bathroom breaks.   The preferred sleep position is supine , with the support of 2 pillows.  Dreams are reportedly rare.  3.30  AM is the usual rise time. The patient wakes up with an alarm. He snoozes his alarm, is often late for work.  He reports not feeling refreshed or restored in  AM, with symptoms such as dry mouth, morning headaches, and residual fatigue.  Naps are taken frequently, dozes off, Its enough to sit still for 2-3 minutes.     ROS:   14 system review of systems performed and negative with exception of ***  PMH:  Past Medical History:  Diagnosis Date   ADHD    Anxiety    Depression    Hyperlipidemia    Hypertension    Sleep apnea     PSH:  Past Surgical History:  Procedure Laterality Date   COLONOSCOPY W/ POLYPECTOMY      Social History:  Social History   Socioeconomic History   Marital status: Married    Spouse name: Not on file   Number of children: Not on file   Years of education: Not on file   Highest education level: Not on file  Occupational History   Not on file  Tobacco Use   Smoking status: Never   Smokeless tobacco: Never  Vaping Use   Vaping Use: Never used  Substance and Sexual Activity   Alcohol use: Yes    Comment: 2-3 times per wk, unsure of amount   Drug use: Not Currently   Sexual activity: Yes  Other Topics Concern   Not on file  Social History Narrative   Not on file   Social Determinants of Health   Financial Resource Strain: Not on file  Food Insecurity: Not on file  Transportation Needs: Not on file  Physical Activity: Not on file  Stress: Not on file  Social Connections: Not on file  Intimate Partner Violence: Not on file    Family History:  Family History  Problem Relation Age of Onset   Diabetes Mother    Liver cancer Mother    Diabetes Father     Medications:   Current Outpatient Medications on File Prior to Visit  Medication Sig Dispense Refill   amLODipine (NORVASC) 5 MG tablet Take 5 mg by mouth in the morning.     amphetamine-dextroamphetamine (ADDERALL) 10 MG tablet Take 1 tablet (10 mg total) by mouth 3 (three) times daily as needed. 90 tablet 0   atenolol (TENORMIN) 100 MG tablet Take 100 mg by mouth every morning.     atorvastatin (LIPITOR) 10 MG tablet Take 10 mg by  mouth every morning.     busPIRone (BUSPAR) 10 MG tablet Take 10 mg by mouth 2 (two) times daily.     escitalopram (LEXAPRO) 10 MG tablet Take 1 tablet (10 mg total) by mouth daily. 30 tablet 0   sildenafil (VIAGRA) 100 MG tablet Take 100 mg  by mouth daily as needed for erectile dysfunction.     zolpidem (AMBIEN) 10 MG tablet Take 10 mg by mouth at bedtime as needed for sleep.     No current facility-administered medications on file prior to visit.    Allergies:  No Known Allergies    OBJECTIVE:  Physical Exam  There were no vitals filed for this visit. There is no height or weight on file to calculate BMI. No results found.   General: well developed, well nourished, seated, in no evident distress Head: head normocephalic and atraumatic.   Neck: supple with no carotid or supraclavicular bruits Cardiovascular: regular rate and rhythm, no murmurs Musculoskeletal: no deformity Skin:  no rash/petichiae Vascular:  Normal pulses all extremities   Neurologic Exam Mental Status: Awake and fully alert. Oriented to place and time. Recent and remote memory intact. Attention span, concentration and fund of knowledge appropriate. Mood and affect appropriate.  Cranial Nerves: Pupils equal, briskly reactive to light. Extraocular movements full without nystagmus. Visual fields full to confrontation. Hearing intact. Facial sensation intact. Face, tongue, palate moves normally and symmetrically.  Motor: Normal bulk and tone. Normal strength in all tested extremity muscles Sensory.: intact to touch , pinprick , position and vibratory sensation.  Coordination: Rapid alternating movements normal in all extremities. Finger-to-nose and heel-to-shin performed accurately bilaterally. Gait and Station: Arises from chair without difficulty. Stance is normal. Gait demonstrates normal stride length and balance without use of AD. Tandem walk and heel toe without difficulty.  Reflexes: 1+ and symmetric. Toes  downgoing.         ASSESSMENT/PLAN: Kyle Carter is a 56 y.o. year old male ***.     OSA on CPAP : Compliance report shows satisfactory usage with optimal residual AHI.  Discussed continued nightly usage with ensuring greater than 4 hours nightly for optimal benefit and per insurance purposes.  Continue to follow with DME company for any needed supplies or CPAP related concerns     Follow up in *** or call earlier if needed   CC:  PCP: [place], Excell Seltzer., MD    I spent *** minutes of face-to-face and non-face-to-face time with patient.  This included previsit chart review, lab review, study review, order entry, electronic health record documentation, patient education regarding diagnosis of sleep apnea with review and discussion of compliance report and answered all other questions to patient's satisfaction   Ihor Austin, [facility]  [facility] [address] [place], [place] [address]  Phone [phone] Fax [phone] Note: This document was prepared with digital dictation and possible smart phrase technology. Any transcriptional errors that result from this process are unintentional.

## 2021-12-22 ENCOUNTER — Ambulatory Visit (INDEPENDENT_AMBULATORY_CARE_PROVIDER_SITE_OTHER): Payer: BC Managed Care – PPO | Admitting: Adult Health

## 2021-12-22 ENCOUNTER — Encounter: Payer: Self-pay | Admitting: Adult Health

## 2021-12-22 VITALS — BP 127/67 | HR 56 | Ht 71.0 in | Wt 261.0 lb

## 2021-12-22 DIAGNOSIS — G4733 Obstructive sleep apnea (adult) (pediatric): Secondary | ICD-10-CM

## 2021-12-22 DIAGNOSIS — G4719 Other hypersomnia: Secondary | ICD-10-CM | POA: Diagnosis not present

## 2021-12-22 MED ORDER — AMPHETAMINE-DEXTROAMPHETAMINE 10 MG PO TABS: 10.0000 mg | ORAL_TABLET | Freq: Three times a day (TID) | ORAL | 0 refills | Status: DC | PRN

## 2021-12-22 NOTE — Progress Notes (Signed)
Faxed orders to DME: [facility] on file.

## 2022-02-24 ENCOUNTER — Other Ambulatory Visit: Payer: Self-pay | Admitting: Adult Health

## 2022-02-27 MED ORDER — AMPHETAMINE-DEXTROAMPHETAMINE 10 MG PO TABS: 10.0000 mg | ORAL_TABLET | Freq: Three times a day (TID) | ORAL | 0 refills | Status: DC | PRN

## 2022-03-08 ENCOUNTER — Other Ambulatory Visit: Payer: Self-pay | Admitting: Orthopedic Surgery

## 2022-03-08 DIAGNOSIS — S83203S Other tear of unspecified meniscus, current injury, right knee, sequela: Secondary | ICD-10-CM

## 2022-03-15 ENCOUNTER — Other Ambulatory Visit: Payer: BC Managed Care – PPO

## 2022-04-05 ENCOUNTER — Other Ambulatory Visit: Payer: Self-pay | Admitting: Neurology

## 2022-04-06 MED ORDER — AMPHETAMINE-DEXTROAMPHETAMINE 10 MG PO TABS: 10.0000 mg | ORAL_TABLET | Freq: Three times a day (TID) | ORAL | 0 refills | Status: DC | PRN

## 2022-05-03 ENCOUNTER — Other Ambulatory Visit: Payer: Self-pay | Admitting: Neurology

## 2022-05-04 ENCOUNTER — Other Ambulatory Visit: Payer: Self-pay

## 2022-05-04 MED ORDER — AMPHETAMINE-DEXTROAMPHETAMINE 10 MG PO TABS: 10.0000 mg | ORAL_TABLET | Freq: Three times a day (TID) | ORAL | 0 refills | Status: DC | PRN

## 2022-05-16 ENCOUNTER — Encounter: Payer: Self-pay | Admitting: Adult Health

## 2022-06-09 ENCOUNTER — Other Ambulatory Visit: Payer: Self-pay | Admitting: Neurology

## 2022-06-10 MED ORDER — AMPHETAMINE-DEXTROAMPHETAMINE 10 MG PO TABS: 10.0000 mg | ORAL_TABLET | Freq: Three times a day (TID) | ORAL | 0 refills | Status: DC | PRN

## 2022-06-10 NOTE — Telephone Encounter (Signed)
Pt last seen on 12/22/21 Follow up scheduled on 06/23/22 with Janett Billow Last filed on 05/05/22 #90 (30 day supply) Rx pending to be signed

## 2022-06-23 ENCOUNTER — Ambulatory Visit: Payer: BC Managed Care – PPO | Admitting: Adult Health

## 2022-07-08 ENCOUNTER — Ambulatory Visit: Payer: BC Managed Care – PPO | Admitting: Neurology

## 2022-07-14 NOTE — Progress Notes (Deleted)
Patient: Kyle Carter Date of Birth: November 20, 1965  Reason for Visit: Follow up History from: Patient Primary Neurologist: Dohmeier   ASSESSMENT AND PLAN 57 y.o. year old male   1.  OSA on CPAP 2.  Excessive daytime sleepiness   HISTORY OF PRESENT ILLNESS: Today 07/14/22  HISTORY  Update 12/22/2021 JM: Patient returns for initial apnea compliance visit.  Completed CPAP titration 01/2021 which showed moderate to severe OSA with adequate treatment under BiPAP with set up date 03/05/2022.  For the most part, feels like he is tolerating BiPAP well although he does have consistent leaking around his mask at the bridge of his nose and at times may feel bloated upon awakening.  Dr. Vickey Huger took over prescribing of Adderall currently taking 10 mg 3 times daily as needed which has been beneficial.  He is requesting a refill today. Epworth Sleepiness Scale 10/24.   REVIEW OF SYSTEMS: Out of a complete 14 system review of symptoms, the patient complains only of the following symptoms, and all other reviewed systems are negative.  See HPI  ALLERGIES: No Known Allergies  HOME MEDICATIONS: Outpatient Medications Prior to Visit  Medication Sig Dispense Refill   amLODipine (NORVASC) 5 MG tablet Take 5 mg by mouth in the morning.     amphetamine-dextroamphetamine (ADDERALL) 10 MG tablet Take 1 tablet (10 mg total) by mouth 3 (three) times daily as needed. 90 tablet 0   atenolol (TENORMIN) 100 MG tablet Take 100 mg by mouth every morning.     atorvastatin (LIPITOR) 10 MG tablet Take 10 mg by mouth every morning.     busPIRone (BUSPAR) 10 MG tablet Take 10 mg by mouth 2 (two) times daily.     escitalopram (LEXAPRO) 10 MG tablet Take 1 tablet (10 mg total) by mouth daily. 30 tablet 0   sildenafil (VIAGRA) 100 MG tablet Take 100 mg by mouth daily as needed for erectile dysfunction.     zolpidem (AMBIEN) 10 MG tablet Take 10 mg by mouth at bedtime as needed for sleep.     No  facility-administered medications prior to visit.    PAST MEDICAL HISTORY: Past Medical History:  Diagnosis Date   ADHD    Anxiety    Depression    Hyperlipidemia    Hypertension    Sleep apnea     PAST SURGICAL HISTORY: Past Surgical History:  Procedure Laterality Date   COLONOSCOPY W/ POLYPECTOMY      FAMILY HISTORY: Family History  Problem Relation Age of Onset   Diabetes Mother    Liver cancer Mother    Diabetes Father     SOCIAL HISTORY: Social History   Socioeconomic History   Marital status: Married    Spouse name: Not on file   Number of children: Not on file   Years of education: Not on file   Highest education level: Not on file  Occupational History   Not on file  Tobacco Use   Smoking status: Never   Smokeless tobacco: Never  Vaping Use   Vaping Use: Never used  Substance and Sexual Activity   Alcohol use: Yes    Comment: 2-3 times per wk, unsure of amount   Drug use: Not Currently   Sexual activity: Yes  Other Topics Concern   Not on file  Social History Narrative   Not on file   Social Determinants of Health   Financial Resource Strain: Not on file  Food Insecurity: Not on file  Transportation Needs: Not  on file  Physical Activity: Not on file  Stress: Not on file  Social Connections: Not on file  Intimate Partner Violence: Not on file    PHYSICAL EXAM  There were no vitals filed for this visit. There is no height or weight on file to calculate BMI.  Generalized: Well developed, in no acute distress  Neurological examination  Mentation: Alert oriented to time, place, history taking. Follows all commands speech and language fluent Cranial nerve II-XII: Pupils were equal round reactive to light. Extraocular movements were full, visual field were full on confrontational test. Facial sensation and strength were normal. Uvula tongue midline. Head turning and shoulder shrug  were normal and symmetric. Motor: The motor testing reveals 5  over 5 strength of all 4 extremities. Good symmetric motor tone is noted throughout.  Sensory: Sensory testing is intact to soft touch on all 4 extremities. No evidence of extinction is noted.  Coordination: Cerebellar testing reveals good finger-nose-finger and heel-to-shin bilaterally.  Gait and station: Gait is normal. Tandem gait is normal. Romberg is negative. No drift is seen.  Reflexes: Deep tendon reflexes are symmetric and normal bilaterally.   DIAGNOSTIC DATA (LABS, IMAGING, TESTING) - I reviewed patient records, labs, notes, testing and imaging myself where available.  Lab Results  Component Value Date   WBC 6.6 10/12/2021   HGB 15.1 10/12/2021   HCT 45.5 10/12/2021   MCV 88.2 10/12/2021   PLT 240 10/12/2021      Component Value Date/Time   NA 138 10/12/2021 1910   K 3.9 10/12/2021 1910   CL 106 10/12/2021 1910   CO2 21 (L) 10/12/2021 1910   GLUCOSE 99 10/12/2021 1910   BUN 12 10/12/2021 1910   CREATININE 1.00 10/12/2021 1910   CALCIUM 9.1 10/12/2021 1910   PROT 6.9 10/12/2021 1910   ALBUMIN 4.1 10/12/2021 1910   AST 20 10/12/2021 1910   ALT 24 10/12/2021 1910   ALKPHOS 41 10/12/2021 1910   BILITOT 1.3 (H) 10/12/2021 1910   GFRNONAA >60 10/12/2021 1910   GFRAA (L) 04/26/2008 2240    43        The eGFR has been calculated using the MDRD equation. This calculation has not been validated in all clinical situations. eGFR's persistently <60 mL/min signify possible Chronic Kidney Disease.   No results found for: "CHOL", "HDL", "LDLCALC", "LDLDIRECT", "TRIG", "CHOLHDL" No results found for: "HGBA1C" Lab Results  Component Value Date   VITAMINB12 184 10/15/2021   Lab Results  Component Value Date   TSH 1.697 10/15/2021    Margie Ege, AGNP-C, DNP 07/14/2022, 4:41 PM [name] [address], [address] [place], [place] [address] [phone]

## 2022-07-15 ENCOUNTER — Ambulatory Visit: Payer: BC Managed Care – PPO | Admitting: Neurology

## 2022-07-27 ENCOUNTER — Other Ambulatory Visit: Payer: Self-pay | Admitting: Neurology

## 2022-07-27 MED ORDER — AMPHETAMINE-DEXTROAMPHETAMINE 10 MG PO TABS: 10.0000 mg | ORAL_TABLET | Freq: Three times a day (TID) | ORAL | 0 refills | Status: DC | PRN

## 2022-08-11 ENCOUNTER — Ambulatory Visit (INDEPENDENT_AMBULATORY_CARE_PROVIDER_SITE_OTHER): Payer: BC Managed Care – PPO | Admitting: Neurology

## 2022-08-11 ENCOUNTER — Encounter: Payer: Self-pay | Admitting: Neurology

## 2022-08-11 VITALS — BP 120/65 | HR 55 | Ht 71.0 in | Wt 255.0 lb

## 2022-08-11 DIAGNOSIS — G4733 Obstructive sleep apnea (adult) (pediatric): Secondary | ICD-10-CM

## 2022-08-11 DIAGNOSIS — G4719 Other hypersomnia: Secondary | ICD-10-CM | POA: Diagnosis not present

## 2022-08-11 DIAGNOSIS — F5112 Insufficient sleep syndrome: Secondary | ICD-10-CM

## 2022-08-11 DIAGNOSIS — K08109 Complete loss of teeth, unspecified cause, unspecified class: Secondary | ICD-10-CM

## 2022-08-11 DIAGNOSIS — R454 Irritability and anger: Secondary | ICD-10-CM

## 2022-08-11 NOTE — Patient Instructions (Signed)

## 2022-08-11 NOTE — Progress Notes (Signed)
Provider:  Melvyn Novas, MD  Primary Care Physician:  Nonnie Done., MD [address] [place] [address]     Referring Provider: Nonnie Done., Md [address] [place],  [place] [address]          Chief Complaint according to patient   Patient presents with:     New Patient (Initial Visit)           HISTORY OF PRESENT ILLNESS:  Kyle Carter is a 57 y.o. male patient who is here for revisit 08/11/2022 on BIPAP and FFM .  Chief concern according to patient :  OSA treated on BiPAP:  I sleep good in general.  FFM user.  Uses Adderall10 mg IR tabs helps to not sleep in daytime.  He feels safe to drive and operate machinery.  Patient has been highly compliant by days he is using the machine 28 out of 30 days the last day of this download was 08-09-2022.  However 18 of those days were less than 4 hours and he is following short of the compliance by hours.  The average user time on days used is just 4 hours 7 minutes his BiPAP is set between 21 and 17 so this is a 4 cm pressure split with easy breathe function, his residual AHI is 7.0 which is a good resolution and he has very high air leakage and I think that is a mix of full facemask use and facial hair.  His Epworth sleepiness score is still elevated at 14 out of 24 points but he feels significant improvement when on Adderall.  Fatigue severity is 24/ 63 points.  He has interval history of suicidal ideas, of knee damage meniscus.  He is here with braces, supporting the knees.  He ha Teacher, adult education issues. Started Lamictal 2 days ago.         Update 12/22/2021 JM: Patient returns for initial apnea compliance visit.  Completed CPAP titration 01/2021 which showed moderate to severe OSA with adequate treatment under BiPAP with set up date 03/05/2022.  For the most part, feels like he is tolerating BiPAP well although he does have consistent leaking around his mask at the bridge of his nose and at times may feel  bloated upon awakening.  Dr. Vickey Huger took over prescribing of Adderall currently taking 10 mg 3 times daily as needed which has been beneficial.  He is requesting a refill today. Epworth Sleepiness Scale 10/24.            Consult visit 11/20/2020 Dr. Vickey Huger: Kyle Carter is a 57 y.o. year old White or Caucasian male patient seen here as a referral on 11/20/2020 from PCP for a sleep consultation:. he patient has been using CPAPfor over almost decade, last sleep study in 09-2017, at Dr. Winfred Leeds , [facility].   and last new CPAP issued in the last year 2021.  The patient's wife reports him snoring through the CPAP.    The patient lives in [place] and he has had issues with snoring and residual apnea on his current CPAP.  Again the CPAP is a rather new model.  And I have some compliance download here 1 over the last 90 days which shows a 78% compliance.  The compliance has become more spotty as he is less happy with his CPAP use.  He is actually using at BiPAP with an incoming pressure of 18 and a expiratory  pressure setting of 14 cmH2O is easy breeze function on.  The residual AHI is 26.6/h so what this basically means is that the machine is not working for him.  He has a severe residual apnea by using it.  Most of the events still seem to be obstructive in nature but there are also some central apneas arising.  The air leak at median is 16.2 L and that should not be the cause of the high residual apnea.  Minute ventilation and tidal volume have been in the same range for the last 90 days respiratory rate is not wearing very much. The patient presented with excessive daytime sleepiness reflected and an Epworth sleepiness score of 18 points. There was a report of a dry mouth in the morning witnessed lo ud snoring and witnessed apneas by his spouse. The patient's regular sleep routine is as follows; the patient normally goes to bed around 11 and falls asleep within 10-15 minutes he is  awoken 3 times at night and nocturia and is woken by his alarm clock at 6 in the morning. 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  This functional set up he also has increased daytime sleepiness and currently endorsed the Epworth sleepiness score at 17 points.   He uses a dream wear FFM, and has facial hair.   Review of Systems: Out of a complete 14 system review, the patient complains of only the following symptoms, and all other reviewed systems are negative.:  Fatigue, sleepiness, fragmented sleep, Nocturia   How likely are you to doze in the following situations: 0 = not likely, 1 = slight chance, 2 = moderate chance, 3 = high chance   Sitting and Reading? Watching Television? Sitting inactive in a public place (theater or meeting)? As a passenger in a car for an hour without a break? Lying down in the afternoon when circumstances permit? Sitting and talking to someone? Sitting quietly after lunch without alcohol? In a car, while stopped for a few minutes in traffic?   His Epworth sleepiness score is still elevated at 14 out of 24 points but he feels significant improvement when on Adderall.  Fatigue severity is 24/ 63 points.  Social History   Socioeconomic History   Marital status: Married    Spouse name: Not on file   Number of children: Not on file   Years of education: Not on file   Highest education level: Not on file  Occupational History   Not on file  Tobacco Use   Smoking status: Never   Smokeless tobacco: Never  Vaping Use   Vaping Use: Never used  Substance and Sexual Activity   Alcohol use: Yes    Comment: 2-3 times per wk, unsure of amount   Drug use: Not Currently   Sexual activity: Yes  Other Topics Concern   Not on file  Social History Narrative   Not on file   Social Determinants of Health   Financial Resource Strain: Not on file  Food Insecurity: Not on file  Transportation Needs: Not on file  Physical Activity: Not on file   Stress: Not on file  Social Connections: Not on file    Family History  Problem Relation Age of Onset   Diabetes Mother    Liver cancer Mother    Diabetes Father     Past Medical History:  Diagnosis Date   ADHD    Anxiety    Depression    Hyperlipidemia  Hypertension    Sleep apnea     Past Surgical History:  Procedure Laterality Date   COLONOSCOPY W/ POLYPECTOMY       Current Outpatient Medications on File Prior to Visit  Medication Sig Dispense Refill   amLODipine (NORVASC) 5 MG tablet Take 5 mg by mouth in the morning.     amphetamine-dextroamphetamine (ADDERALL) 10 MG tablet Take 1 tablet (10 mg total) by mouth 3 (three) times daily as needed. 90 tablet 0   atenolol (TENORMIN) 100 MG tablet Take 100 mg by mouth every morning.     atorvastatin (LIPITOR) 10 MG tablet Take 10 mg by mouth every morning.     busPIRone (BUSPAR) 10 MG tablet Take 10 mg by mouth 2 (two) times daily.     escitalopram (LEXAPRO) 10 MG tablet Take 1 tablet (10 mg total) by mouth daily. 30 tablet 0   lamoTRIgine (LAMICTAL) 25 MG tablet Take 25 mg by mouth daily.     sildenafil (VIAGRA) 100 MG tablet Take 100 mg by mouth daily as needed for erectile dysfunction.     zolpidem (AMBIEN) 10 MG tablet Take 10 mg by mouth at bedtime as needed for sleep.     No current facility-administered medications on file prior to visit.    No Known Allergies   DIAGNOSTIC DATA (LABS, IMAGING, TESTING) - I reviewed patient records, labs, notes, testing and imaging myself where available.   All night BiPAP titration with 4 cm spread.  Edentulous patient . All night supine sleep.  No resolution of apnea found, only high air leak in spite of FFM and chin strap. Hypoxia was noted, nadir at 74%.  Did best under 21/17 cm water, with AHI 11.9. and nearly all residual events were hypopneas.    The patient was fitted with a Simplus FFM mask, BiPAP will be used at 21/16 cm water  The patient was on BIPAP before and  felt he couldn't tolerate the device. One problem is the high air leakage- he needs dentures to support facial structure under the mask. Chin strap alone did not help. His BiPAP should be reset in the waiting time for this test to 21/16 cm water. Once dentures are present, will retry CPAP/ and if needed BiPAP with various spread ( 4 , 5 , 6 cm difference Inspiratory versus Expiratory pressure).                 Lab Results  Component Value Date   WBC 6.6 10/12/2021   HGB 15.1 10/12/2021   HCT 45.5 10/12/2021   MCV 88.2 10/12/2021   PLT 240 10/12/2021      Component Value Date/Time   NA 138 10/12/2021 1910   K 3.9 10/12/2021 1910   CL 106 10/12/2021 1910   CO2 21 (L) 10/12/2021 1910   GLUCOSE 99 10/12/2021 1910   BUN 12 10/12/2021 1910   CREATININE 1.00 10/12/2021 1910   CALCIUM 9.1 10/12/2021 1910   PROT 6.9 10/12/2021 1910   ALBUMIN 4.1 10/12/2021 1910   AST 20 10/12/2021 1910   ALT 24 10/12/2021 1910   ALKPHOS 41 10/12/2021 1910   BILITOT 1.3 (H) 10/12/2021 1910   GFRNONAA >60 10/12/2021 1910   GFRAA (L) 04/26/2008 2240    43        The eGFR has been calculated using the MDRD equation. This calculation has not been validated in all clinical situations. eGFR's persistently <60 mL/min signify possible Chronic Kidney Disease.   No results found for: "CHOL", "  HDL", "LDLCALC", "LDLDIRECT", "TRIG", "CHOLHDL" No results found for: "HGBA1C" Lab Results  Component Value Date   VITAMINB12 184 10/15/2021   Lab Results  Component Value Date   TSH 1.697 10/15/2021    PHYSICAL EXAM:  Today's Vitals   08/11/22 1612  BP: 120/65  Pulse: (!) 55  Weight: 255 lb (115.7 kg)  Height: 5\' 11"  (1.803 m)   Body mass index is 35.57 kg/m.   Wt Readings from Last 3 Encounters:  08/11/22 255 lb (115.7 kg)  12/22/21 261 lb (118.4 kg)  10/12/21 238 lb (108 kg)     Ht Readings from Last 3 Encounters:  08/11/22 5\' 11"  (1.803 m)  12/22/21 5\' 11"  (1.803 m)  10/12/21 5\' 11"   (1.803 m)      General: The patient is awake, alert and appears not in acute distress. The patient is well groomed. Head: Normocephalic, atraumatic. Neck is supple. Mallampati 3,  neck circumference:18 inches . Nasal airflow barely- deviated, left side barely patent.  Retrognathia is not seen.  Dental status: edentulous Cardiovascular:  Regular rate and cardiac rhythm by pulse,  without distended neck veins. Respiratory: Lungs are clear to auscultation.  Skin:  Without evidence of ankle edema, or rash. Trunk: The patient's posture is erect.   Neurologic exam : The patient is awake and alert, oriented to place and time.   Memory subjective described as intact.  Attention span & concentration ability appears normal.  Speech is fluent,  without  dysarthria, dysphonia or aphasia.  Mood and affect are appropriate.   Cranial nerves: no loss of smell or taste reported  Pupils are equal and briskly reactive to light. Funduscopic exam deferred. .  Extraocular movements in vertical and horizontal planes were intact and without nystagmus. No Diplopia. Visual fields by finger perimetry are intact. Hearing was intact to soft voice and finger rubbing.    Facial sensation intact to fine touch.  Facial motor strength is symmetric and tongue and uvula move midline.  Neck ROM : rotation, tilt and flexion extension were normal for age and shoulder shrug was symmetrical.    Motor exam:  Symmetric bulk, tone and ROM.   Normal tone without cog -wheeling, symmetric grip strength .   Sensory:  vibration were normal.  Proprioception tested in the upper extremities was normal.   Coordination: Rapid alternating movements in the fingers/hands were of normal speed.  The Finger-to-nose maneuver was intact without evidence of ataxia, dysmetria or tremor.   Gait and station: Patient could rise unassisted from a seated position, walked without assistive device.  Stance is of normal width/ base and the patient  turned with 3 steps.  Toe and heel walk were deferred.  Deep tendon reflexes: in the  upper and lower extremities are symmetric and intact.  Babinski response was deferred .    ASSESSMENT AND PLAN 57 y.o. year old male  here with:    1) Poor compliance on BiPAP, anger issues, depressed-  had suicidal ideas and voluntarily went to treatment.   2)  He needs to continue biPAP and shave, he may want to start wearing dentures at night ! FFM otherwise is comfortable.   3) he will continue to use adderall for s daytime sleepiness, received supplies today per mail.   Will  follow up through our NP within 12 months.   I would like to thank  Nonnie Done., Md [address] [place],  [place] [address] for allowing me to meet with and to take  care of this pleasant patient.    After spending a total time of  30  minutes face to face and additional time for physical and neurologic examination, review of laboratory studies,  personal review of imaging studies, reports and results of other testing and review of referral information / records as far as provided in visit,   Electronically signed by: Melvyn Novas, MD 08/11/2022 4:33 PM  [name] and [facility] Sleep Board certified by The [employer] of Sleep Medicine and Diplomate of the [employer] of Sleep Medicine. Board certified In Neurology through the ABPN, Fellow of the [employer] of Neu[employer]. Medical Director of [employer].

## 2022-09-19 ENCOUNTER — Other Ambulatory Visit: Payer: Self-pay | Admitting: Neurology

## 2022-09-21 MED ORDER — AMPHETAMINE-DEXTROAMPHETAMINE 10 MG PO TABS: 10.0000 mg | ORAL_TABLET | Freq: Three times a day (TID) | ORAL | 0 refills | Status: DC | PRN

## 2022-09-22 ENCOUNTER — Ambulatory Visit (INDEPENDENT_AMBULATORY_CARE_PROVIDER_SITE_OTHER): Payer: BC Managed Care – PPO | Admitting: Neurology

## 2022-09-22 ENCOUNTER — Telehealth: Payer: Self-pay | Admitting: Neurology

## 2022-09-22 ENCOUNTER — Encounter: Payer: Self-pay | Admitting: Neurology

## 2022-09-22 VITALS — BP 131/73 | HR 50 | Ht 71.0 in | Wt 255.0 lb

## 2022-09-22 DIAGNOSIS — F10988 Alcohol use, unspecified with other alcohol-induced disorder: Secondary | ICD-10-CM | POA: Diagnosis not present

## 2022-09-22 DIAGNOSIS — R404 Transient alteration of awareness: Secondary | ICD-10-CM | POA: Diagnosis not present

## 2022-09-22 DIAGNOSIS — R41840 Attention and concentration deficit: Secondary | ICD-10-CM | POA: Diagnosis not present

## 2022-09-22 DIAGNOSIS — Z818 Family history of other mental and behavioral disorders: Secondary | ICD-10-CM

## 2022-09-22 DIAGNOSIS — G3184 Mild cognitive impairment, so stated: Secondary | ICD-10-CM

## 2022-09-22 DIAGNOSIS — R4189 Other symptoms and signs involving cognitive functions and awareness: Secondary | ICD-10-CM

## 2022-09-22 NOTE — Progress Notes (Signed)
[name]  SLEEP MEDICINE CLINIC  Provider:  Dr Mariesa Grieder Referring Provider: Nonnie Done., MD Primary Care Physician:  Nonnie Done., MD  Chief Complaint  Patient presents with   Memory Loss    Rm 1 with spouse Lissa Hoard Pt is well, reports he can have a conversation and forget what is was said later on. Also mentions forgetting names and known locations.  Memory loss progressed in the last two years. Established here as CPAP user. Wife mentions: he drank ETOH often and stopped less that a year ago.     HPI:  Kyle Carter is a 57 y.o. male and usually seen here for sleep medicine follow on BIPAP, but today's appointment was scheduled upon request by his wife and referral from PCP Dr. Egbert Garibaldi for a Consultation/ Evaluation of Memory loss. He is accompanied by his wife , who is very concerned and gave special examples of memory loss.  He was to meet with someone to discuss several items, he forgot to do so, and he writes list but then can't find the lists.  His wife called him and texted  him while he ate at [facility], and asked him to get something at the pharmacy, he drove straight home and forgot.   She has put directions on his phone but he hasn't been able to follow navigation on his phone.    This patient reports onset of memory decline over a period of 24 months, with increasing frequency of misplacing and losing items.  Keys, purse, phone, lap top, a cheque - he lost 3 cheques on which his wife had to stop payment. 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   He cannot multitask- and she feels he is inattentive when driving. Easily distracted. Safety concerns.  She has also noted him to stare off, not reacting to verbal stimuli or touch, staring into the TV screen. Absence?  Hearing loss?  ADD?  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 He  now  has some of the lisp again .   He used to drink severely, 6 drinks a day, and more. Last year he quit , but memory has not recovered.   Family history:  paternal grandfather  died in a nursing home, couldn't recognize his family, apraxia ,  Dementia. Parents: mother died at age 78 of cancer, father died at 68 and of cardiomyopathy/ hemolytic anemia.    Last Visit in sleep. Kyle Carter is a 57 y.o. male patient who is here for revisit 08/11/2022 on BIPAP and FFM .  Chief concern according to patient :  OSA treated on BiPAP:  "I sleep good in general".  FFM user.  Uses Adderall10 mg IR tabs helps to not sleep in daytime.  He feels safe to drive and operate machinery.  Patient has been highly compliant by days he is using the machine 28 out of 30 days the last day of this download was 08-09-2022.  However 18 of those days were less than 4 hours and he is following short of the compliance by hours.  The average user time on days used is just 4 hours 7 minutes his BiPAP is set between 21 and 17 so this is a 4 cm pressure split with easy breathe function, his residual AHI is 7.0 which is a good resolution and  he has very high air leakage and I think that is a mix of full facemask use and facial hair.   His Epworth sleepiness score is still elevated at 14 out of 24 points but he feels significant improvement when on Adderall.  Fatigue severity is 24/ 63 points.  He has interval history of suicidal ideas, of knee damage meniscus.  He is here with braces, supporting the knees.  He ha Teacher, adult education issues. Started Lamictal 2 days ago.       Update 12/22/2021 JM: Patient returns for initial apnea compliance visit.  Completed CPAP titration 01/2021 which showed moderate to severe OSA with adequate treatment under BiPAP with set up date 03/05/2022.  For the most part, feels like he is tolerating BiPAP well although he does have consistent leaking around his mask at the bridge of his nose and at times  may feel bloated upon awakening.  Dr. Vickey Huger took over prescribing of Adderall currently taking 10 mg 3 times daily as needed which has been beneficial.  He is requesting a refill today. Epworth Sleepiness Scale 10/24.   Review of Systems: Out of a complete 14 system review, the patient complains of only the following symptoms, and all other reviewed systems are negative.   OSA on BiPAP, skin cancer, memory problems, recovering alcoholic.   Social History   Socioeconomic History   Marital status: Married    Spouse name: Not on file   Number of children: Not on file   Years of education: Not on file   Highest education level: Not on file  Occupational History   Not on file  Tobacco Use   Smoking status: Never   Smokeless tobacco: Never  Vaping Use   Vaping Use: Never used  Substance and Sexual Activity   Alcohol use: Yes    Comment: 2-3 times per wk, unsure of amount   Drug use: Not Currently   Sexual activity: Yes  Other Topics Concern   Not on file  Social History Narrative   Not on file   Social Determinants of Health   Financial Resource Strain: Not on file  Food Insecurity: Not on file  Transportation Needs: Not on file  Physical Activity: Not on file  Stress: Not on file  Social Connections: Not on file  Intimate Partner Violence: Not on file    Family History  Problem Relation Age of Onset   Diabetes Mother    Liver cancer Mother    Diabetes Father     Past Medical History:  Diagnosis Date   ADHD    Anxiety    Depression    Hyperlipidemia    Hypertension    Sleep apnea     Past Surgical History:  Procedure Laterality Date   COLONOSCOPY W/ POLYPECTOMY      Current Outpatient Medications  Medication Sig Dispense Refill   amLODipine (NORVASC) 5 MG tablet Take 5 mg by mouth in the morning.     amphetamine-dextroamphetamine (ADDERALL) 10 MG tablet Take 1 tablet (10 mg total) by mouth 3 (three) times daily as needed. 90 tablet 0   atenolol  (TENORMIN) 100 MG tablet Take 100 mg by mouth every morning.     atorvastatin (LIPITOR) 10 MG tablet Take 10 mg by mouth every morning.     busPIRone (BUSPAR) 10 MG tablet Take 10 mg by mouth 2 (two) times daily.     escitalopram (LEXAPRO) 10 MG tablet Take 1 tablet (10 mg total) by mouth daily. 30 tablet 0  lamoTRIgine (LAMICTAL) 25 MG tablet Take 25 mg by mouth daily.     sildenafil (VIAGRA) 100 MG tablet Take 100 mg by mouth daily as needed for erectile dysfunction.     zolpidem (AMBIEN) 10 MG tablet Take 10 mg by mouth at bedtime as needed for sleep.     No current facility-administered medications for this visit.    Allergies as of 09/22/2022   (No Known Allergies)    Vitals: BP 131/73   Pulse (!) 50   Ht 5\' 11"  (1.803 m)   Wt 255 lb (115.7 kg)   BMI 35.57 kg/m  Last Weight:  Wt Readings from Last 1 Encounters:  09/22/22 255 lb (115.7 kg)   Last Height:   Ht Readings from Last 1 Encounters:  09/22/22 5\' 11"  (1.803 m)    Physical exam:  General: The patient is awake, alert and appears not in acute distress. The patient is well groomed. Head: Normocephalic, atraumatic. Neck is supple. Mallampati 3,  neck circumference:18 inches . Nasal airflow barely- deviated, left side barely patent.  Retrognathia is not seen.  Dental status: edentulous Cardiovascular:  Regular rate and cardiac rhythm by pulse,  without distended neck veins. Respiratory: Lungs are clear to auscultation.  Skin:  Without evidence of ankle edema, or rash. Trunk: The patient's posture is erect.   Neurologic exam : The patient is awake and alert, oriented to place and time.   Memory subjective described as impaired  Attention span & concentration ability appears impaired-  >MMSE 30/30  [employer] graduate     09/22/2022    2:02 PM  Montreal Cognitive Assessment   Visuospatial/ Executive (0/5) 4  Naming (0/3) 3  Attention: Read list of digits (0/2) 2  Attention: Read list of letters (0/1) 1   Attention: Serial 7 subtraction starting at 100 (0/3) 3  Language: Repeat phrase (0/2) 2  Language : Fluency (0/1) 1  Abstraction (0/2) 2  Delayed Recall (0/5) 3  Orientation (0/6) 6  Total 27     Speech is fluent, without dysarthria, dysphonia or aphasia.  Mood and affect are depressed, worried, frustrated.    Cranial nerves: no loss of smell or taste reported  Pupils are equal and briskly reactive to light. Funduscopic exam deferred. .  Extraocular movements in vertical and horizontal planes were intact and without nystagmus. No Diplopia. Visual fields by finger perimetry are intact. Hearing was intact to soft voice and finger rubbing.    Facial sensation intact to fine touch.  Facial motor strength is symmetric and tongue and uvula move midline.  Neck ROM : rotation, tilt and flexion extension were normal for age and shoulder shrug was symmetrical.    Motor exam:  Symmetric bulk, tone and ROM.   Normal tone without cog -wheeling, symmetric grip strength .   Sensory:  vibration sense intact  Proprioception tested in the upper extremities was normal.   Coordination: Rapid alternating movements in the fingers/hands were of normal speed.  The Finger-to-nose maneuver was intact without evidence of ataxia, dysmetria or tremor.   Gait and station: Patient could rise unassisted from a seated position, walked without assistive device.  Stance is of normal width/ base and the patient turned with 3 steps.  Toe and heel walk were deferred.  Deep tendon reflexes: in the  upper and lower extremities are symmetric and intact.  Babinski response was deferred .     Assessment: Total time for face to face interview and examination, for review of  images  and laboratory testing, neurophysiology testing and pre-existing records, including out-of -network , was  minutes. Assessment is as follows here:  1)   the parents of this 50 year old Caucasian gentleman died at age 55 and age 67, there  was no history of dementia aches most at that time, but a paternal grandfather lived in a nursing home and seems to have had quite significant cognitive impairment.  There is no contact with his only sibling, a brother.  2) social history positive for heavy alcohol use until 10 months ago, by the time that he quit drinking he had already displayed some significant memory issues, but his wife now reports also staring attacks- and wernicke encephalopathy is in the differential. He is unresponsive to a verbal stimulus or even to touch staring off.  He does not lose control over his body function. At night in his sleep his wife has noticed rhythmic leg movements and these last 1 minute and have sometimes been associated with ENURESIS.  3) his Mini-Mental status examination at his primary care office was remarkable 30 out of 30 on the MMSE, and here today on a Montreal cognitive assessment he scored again very high at 27 out of 30 points.  Mr. 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   Our goal will be to evaluate brain anatomy and make sure that there is no vascular component to his memory loss no mini strokes no tumor no bleed.   #2 I will order biomarkers that are associated with memory loss disorders.   #3 I will order an EEG because I am concerned that he may have a seizure when he is staring off.   We discussed the mind diet>  I definitely want him to continue with his abstinence from alcohol, blood pressure medicine, depression medicine, he is on Adderall 30 mg which helps with alertness but it does not seem to help much with attention.  The clinical description of his day by day memory lapses are concerning and I would not want this patient at this time to drive until we have a better understanding of his cognitive function and the trend of his cognitive health.   Plan:  Treatment  plan and additional workup planned after today includes:   1)  Dementia blood panel.  2)  phos Tau, TATN brain 4) EEG   RV with Shanda Bumps or me in 3 months. No DRIVING for now. !   Melvyn Novas, MD

## 2022-09-22 NOTE — Telephone Encounter (Signed)
Pt stated he is no longer seeing PCP and would like to know if Dr. Vickey Huger would take over prescribing zolpidem (AMBIEN) 10 MG tablet.

## 2022-09-22 NOTE — Telephone Encounter (Signed)
Contacted pt back, informed him Dr Vickey Huger typically doesn't manage these type of medications. She did place a Neuropsych referral for him, advised he discuss this with them during the visit or get new care established with PCP. He verbally understood and was appreciative.

## 2022-09-22 NOTE — Patient Instructions (Signed)
Will check for seizures, for alzheimer biomarkers.    Wernicke-Korsakoff Syndrome Wernicke-Korsakoff syndrome (WKS) is a brain disorder that impairs a person's thinking, eye movements, and balance and then causes long-term memory loss. The term actually describes two phases of the same condition. Wernicke encephalopathy happens first. If the disease gets worse, Wernicke encephalopathy is followed by Korsakoff syndrome (Korsakoff psychosis). It is caused by not getting enough vitamin B1 (thiamine). This condition can permanently damage the areas of the brain that are responsible for memory. Wernicke encephalopathy is a medical emergency. What are the causes? This condition is caused by a lack of thiamine in the body. This deficiency can result from: Alcoholism. This is the most common cause. Poor nutrition. Certain medical conditions. Bariatric or gastrointestinal surgery. What increases the risk? The following factors may make you more likely to develop this condition: Having severe alcoholism. Poor nutrition. AIDS (acquired immunodeficiency syndrome). Long-term (chronic) infections. Failing to eat properly because of fear of weight gain (anorexia nervosa). Kidney dialysis. Advanced cancer. What are the signs or symptoms? Symptoms of this condition may vary based on the stage of the disease. Symptoms of Wernicke encephalopathy include: Uncoordinated muscles. Abnormal eye movements. Confusion. Loss of other mental abilities. Double vision. Symptoms of alcohol withdrawal. As the disease gets worse, symptoms of Korsakoff syndrome may develop. These include: Severe memory loss. Inability to form new memories. Seeing, hearing, tasting, smelling, or feeling things that are not real (hallucinations). Tendency to make up stories. How is this diagnosed? This condition may be diagnosed based on: Your symptoms and medical history. Your health care provider may suspect this condition if you  abuse alcohol. A physical exam. Blood tests to check your thiamine level and to look for other signs of malnutrition. Tests to check for memory loss. Imaging studies to look for changes in the brain and other body parts. These may include MRI and CT scans. How is this treated? This condition may be treated with: Thiamine replacement therapy. You may be given thiamine through an IV or by mouth. Treatment for alcoholism. Treatment for other nutritional problems. Medicine and mental health counseling for chronic dementia if appropriate. Treatment for this condition needs to start early. If the condition is diagnosed and treated in the early stages, it can be reversed. If the condition is left untreated, it can cause permanent brain damage. Follow these instructions at home:  Take over-the-counter and prescription medicines only as told by your health care provider. Do not drink alcohol. Get treatment for alcoholism if needed. Eat a nutritious, well-balanced diet. Be sure to include plenty of vegetables, fruits, low-fat dairy products, and lean protein. Limit foods that are high in solid fats, added sugars, or salt. Get regular exercise. Most adults should exercise for at least 150 minutes each week. The exercise should increase your heart rate and make you sweat (moderate-intensity exercise). Most adults should also do strengthening exercises at least twice a week. This is in addition to the moderate-intensity exercise. Get caregiver support if needed. Keep all follow-up visits. This is important. Visits may include sessions for mental health counseling or meeting with a nutrition specialist (dietitian). Where to find more information [employer] of Neurological Disorders and Stroke: [employer] IT trainer for Rare Disorders: rarediseases.org Contact a health care provider if: You have confusion or memory problems. You have severe alcoholism. Get help right away  if: Your symptoms become severe. You have serious thoughts about hurting yourself or others. If you ever feel like you may hurt yourself  or others, or have thoughts about taking your own life, get help right away. Go to your nearest emergency department or: Call your local emergency services (911 in the [place] Call a suicide crisis helpline, such as the National Suicide Prevention Lifeline at 585-404-2784 or 988 in the [place] This is open 24 hours a day in the [place] Text the Crisis Text Line at 774-356-3739 (in the [place] Summary Wernicke-Korsakoff syndrome (WKS) is a brain disorder that impairs a person's thinking, eye movements, and balance and then causes long-term memory loss. This condition is caused by a lack of vitamin B1 (thiamine). That may be the result of alcoholism, poor eating habits, or certain medical conditions. Treatment for WKS needs to start early. If the condition is diagnosed and treated in its early stages, it can be reversed. If the condition is left untreated, it can cause permanent brain damage. Keep all follow-up visits. This information is not intended to replace advice given to you by your health care provider. Make sure you discuss any questions you have with your health care provider. Document Revised: 10/15/2020 Document Reviewed: 01/08/2020 Elsevier Patient Education  2024 [employer].

## 2022-09-27 ENCOUNTER — Telehealth: Payer: Self-pay | Admitting: Neurology

## 2022-09-27 NOTE — Telephone Encounter (Signed)
BCBS sent to [facility] [phone] 

## 2022-10-04 ENCOUNTER — Ambulatory Visit: Payer: BC Managed Care – PPO | Admitting: Neurology

## 2022-10-08 ENCOUNTER — Telehealth: Payer: Self-pay | Admitting: Neurology

## 2022-10-08 NOTE — Telephone Encounter (Signed)
Pt's wife, Lissa Hoard, would like call back to discuss to if need to turn in his driving license to [facility]. What would be the process?

## 2022-10-10 ENCOUNTER — Encounter: Payer: Self-pay | Admitting: Neurology

## 2022-10-11 ENCOUNTER — Ambulatory Visit (INDEPENDENT_AMBULATORY_CARE_PROVIDER_SITE_OTHER): Payer: BC Managed Care – PPO | Admitting: Neurology

## 2022-10-11 DIAGNOSIS — R4182 Altered mental status, unspecified: Secondary | ICD-10-CM

## 2022-10-11 DIAGNOSIS — R4189 Other symptoms and signs involving cognitive functions and awareness: Secondary | ICD-10-CM

## 2022-10-11 DIAGNOSIS — R41840 Attention and concentration deficit: Secondary | ICD-10-CM

## 2022-10-11 DIAGNOSIS — G3184 Mild cognitive impairment, so stated: Secondary | ICD-10-CM

## 2022-10-11 DIAGNOSIS — R404 Transient alteration of awareness: Secondary | ICD-10-CM

## 2022-10-11 NOTE — Telephone Encounter (Signed)
At 12:19 Tim from [facility] left a vm re: the requested scan.  Dr Dohmeier's order needs to [facility] faxed [facility] at [phone]  please reference pt's member ID of [id number].  Jorja Loa is asking to be called to confirm additional details, their call back# is [phone]

## 2022-10-11 NOTE — Telephone Encounter (Signed)
Send fax to number provided below

## 2022-10-12 ENCOUNTER — Encounter: Payer: Self-pay | Admitting: Neurology

## 2022-10-12 NOTE — Telephone Encounter (Signed)
Called the wife back. She is actually requesting she can have paperwork completed for her through her work as caretaker for him. Since the pt is not driving, the wife is having to drive and take him to apts, tests and to and from work.  This is affecting her work and whether she can be off or not. Advised we could complete paperwork for her. She would just need to turn it in and advised that a letter would be written for her to use until the official paperwork is completed.

## 2022-10-12 NOTE — Progress Notes (Signed)
Order form completed.

## 2022-10-12 NOTE — Progress Notes (Signed)
Normal EEG . Ambulatory EEG to follow.

## 2022-10-12 NOTE — Procedures (Signed)
    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 Present in the anterior head region is a 15-20 Hz beta activity. Photic stimulation was performed, did not show any abnormalities. Hyperventilation was also performed, did not show any abnormalities. Drowsiness was manifested by background fragmentation. No abnormal epileptiform discharges seen during this recording. There was no focal slowing. There were no electrographic seizure identified.   Abnormality: None   Impression: This is a normal EEG recorded while drowsy and awake. No evidence of interictal epileptiform discharges. Normal EEGs, however, do not rule out epilepsy.    Windell Norfolk, MD [name]

## 2022-10-13 ENCOUNTER — Telehealth: Payer: Self-pay

## 2022-10-13 ENCOUNTER — Encounter: Payer: Self-pay | Admitting: Neurology

## 2022-10-13 NOTE — Telephone Encounter (Signed)
Eeg order sent to [facility]

## 2022-10-16 LAB — INFORMED CONSENT NEEDED

## 2022-10-19 ENCOUNTER — Encounter: Payer: Self-pay | Admitting: Neurology

## 2022-10-19 LAB — PROTEIN ELECTROPHORESIS, SERUM
A/G Ratio: 1.9 — ABNORMAL HIGH (ref 0.7–1.7)
Albumin ELP: 4.1 g/dL (ref 2.9–4.4)
Alpha 1: 0.2 g/dL (ref 0.0–0.4)
Alpha 2: 0.6 g/dL (ref 0.4–1.0)
Beta: 0.9 g/dL (ref 0.7–1.3)
Gamma Globulin: 0.5 g/dL (ref 0.4–1.8)
Globulin, Total: 2.2 g/dL (ref 2.2–3.9)

## 2022-10-19 LAB — CBC WITH DIFFERENTIAL/PLATELET
Basophils Absolute: 0 10*3/uL (ref 0.0–0.2)
Basos: 1 %
EOS (ABSOLUTE): 0.2 10*3/uL (ref 0.0–0.4)
Eos: 5 %
Hematocrit: 40.1 % (ref 37.5–51.0)
Hemoglobin: 13.4 g/dL (ref 13.0–17.7)
Immature Grans (Abs): 0 10*3/uL (ref 0.0–0.1)
Immature Granulocytes: 0 %
Lymphocytes Absolute: 1.3 10*3/uL (ref 0.7–3.1)
Lymphs: 27 %
MCH: 28.8 pg (ref 26.6–33.0)
MCHC: 33.4 g/dL (ref 31.5–35.7)
MCV: 86 fL (ref 79–97)
Monocytes Absolute: 0.5 10*3/uL (ref 0.1–0.9)
Monocytes: 11 %
Neutrophils Absolute: 2.7 10*3/uL (ref 1.4–7.0)
Neutrophils: 56 %
Platelets: 233 10*3/uL (ref 150–450)
RBC: 4.65 x10E6/uL (ref 4.14–5.80)
RDW: 13.7 % (ref 11.6–15.4)
WBC: 4.8 10*3/uL (ref 3.4–10.8)

## 2022-10-19 LAB — COMPREHENSIVE METABOLIC PANEL
ALT: 25 IU/L (ref 0–44)
AST: 25 IU/L (ref 0–40)
Albumin: 4.3 g/dL (ref 3.8–4.9)
Alkaline Phosphatase: 52 IU/L (ref 44–121)
BUN/Creatinine Ratio: 18 (ref 9–20)
BUN: 18 mg/dL (ref 6–24)
Bilirubin Total: 1 mg/dL (ref 0.0–1.2)
CO2: 22 mmol/L (ref 20–29)
Calcium: 9.3 mg/dL (ref 8.7–10.2)
Chloride: 106 mmol/L (ref 96–106)
Creatinine, Ser: 0.99 mg/dL (ref 0.76–1.27)
Globulin, Total: 2 g/dL (ref 1.5–4.5)
Glucose: 91 mg/dL (ref 70–99)
Potassium: 4.5 mmol/L (ref 3.5–5.2)
Sodium: 141 mmol/L (ref 134–144)
Total Protein: 6.3 g/dL (ref 6.0–8.5)
eGFR: 88 mL/min/{1.73_m2} (ref >59)

## 2022-10-19 LAB — VITAMIN B12: Vitamin B-12: 289 pg/mL (ref 232–1245)

## 2022-10-19 LAB — HOMOCYSTEINE: Homocysteine: 12.8 umol/L (ref 0.0–14.5)

## 2022-10-19 LAB — TSH+FREE T4
Free T4: 1.13 ng/dL (ref 0.82–1.77)
TSH: 1.23 u[IU]/mL (ref 0.450–4.500)

## 2022-10-19 LAB — ATN PROFILE
A -- Beta-amyloid 42/40 Ratio: 0.11 (ref >0.102)
Beta-amyloid 40: 217.33 pg/mL
Beta-amyloid 42: 23.86 pg/mL
N -- NfL, Plasma: 2.13 pg/mL (ref 0.00–3.78)
T -- p-tau181: 0.93 pg/mL (ref 0.00–0.97)

## 2022-10-19 LAB — SEDIMENTATION RATE: Sed Rate: 2 mm/hr (ref 0–30)

## 2022-10-19 LAB — HEMOGLOBIN A1C
Est. average glucose Bld gHb Est-mCnc: 114 mg/dL
Hgb A1c MFr Bld: 5.6 % (ref 4.8–5.6)

## 2022-10-19 LAB — METHYLMALONIC ACID, SERUM: Methylmalonic Acid: 152 nmol/L (ref 0–378)

## 2022-10-19 LAB — NEUROFILAMENT LIGHT CHAIN: Neurofilament Light Chain: 2.08 pg/mL (ref 0.00–3.78)

## 2022-10-19 LAB — VITAMIN B1: Thiamine: 110.9 nmol/L (ref 66.5–200.0)

## 2022-10-19 LAB — ANA W/REFLEX: Anti Nuclear Antibody (ANA): NEGATIVE

## 2022-10-19 LAB — RPR: RPR Ser Ql: NONREACTIVE

## 2022-10-19 LAB — EARLY ONSET ALZHEIMER'S PANEL

## 2022-10-19 LAB — HIV ANTIBODY (ROUTINE TESTING W REFLEX): HIV Screen 4th Generation wRfx: NONREACTIVE

## 2022-10-19 NOTE — Progress Notes (Signed)
Unremarkable labs overall- good results.  . A/G ratio is still not indicative of a disorder or disease process.  Normal biomarker panel ( ATN)-  not indicative of a dementing illness, such as Alzheimer's Disease.Marland Kitchen

## 2022-10-19 NOTE — Telephone Encounter (Signed)
Called pt and informed of lab results. Per Dr Dohmeier: Theda Sers labs overall- good results. A/G ratio is still not indicative of a disorder or disease process. Normal biomarker panel ( ATN)-  not indicative of a dementing illness, such as Alzheimer's Disease.. Pt was also given [facility] telephone number so he can call and get his 72 hr EEG scheduled. Pt verbalized understanding.  Pt had no additional questions at this time but was encouraged to call back if questions arise.

## 2022-10-26 ENCOUNTER — Other Ambulatory Visit: Payer: Self-pay | Admitting: Neurology

## 2022-10-26 MED ORDER — AMPHETAMINE-DEXTROAMPHETAMINE 10 MG PO TABS: 10.0000 mg | ORAL_TABLET | Freq: Three times a day (TID) | ORAL | 0 refills | Status: DC | PRN

## 2022-10-28 ENCOUNTER — Telehealth: Payer: Self-pay | Admitting: Neurology

## 2022-10-28 NOTE — Telephone Encounter (Signed)
Received the MRI report that the pt completed through [facility].

## 2022-10-29 NOTE — Telephone Encounter (Signed)
Normal MRI at [facility] . Cannot review images myself.

## 2022-11-08 ENCOUNTER — Encounter: Payer: Self-pay | Admitting: Neurology

## 2022-12-22 ENCOUNTER — Other Ambulatory Visit: Payer: Self-pay | Admitting: Neurology

## 2022-12-22 NOTE — Telephone Encounter (Signed)
Requested Prescriptions   Pending Prescriptions Disp Refills   amphetamine-dextroamphetamine (ADDERALL) 10 MG tablet 90 tablet 0    Sig: Take 1 tablet (10 mg total) by mouth 3 (three) times daily as needed.   Last seen 09/22/22 Next seen 03/28/23  Dispenses  Routing to provider to fill  Dispensed Days Supply Quantity Provider Pharmacy  DEXTROAMP-AMPHETAMIN 10 MG TAB 10/31/2022 30 90 each Dohmeier, Porfirio Mylar, MD [facility] [phone] - R...  DEXTROAMP-AMPHETAMIN 10 MG TAB 09/24/2022 30 90 each Dohmeier, Porfirio Mylar, MD [facility] [phone] - R...  DEXTROAMP-AMPHETAMIN 10 MG TAB 07/27/2022 30 90 each Dohmeier, Porfirio Mylar, MD [facility] [phone] - R...  DEXTROAMP-AMPHETAMIN 10 MG TAB 06/10/2022 30 90 each Dohmeier, Porfirio Mylar, MD [facility] [phone] - R...  DEXTROAMP-AMPHETAMIN 10 MG TAB 05/05/2022 30 90 each Sater, Pearletha Furl, MD [facility] [phone] - R...  DEXTROAMP-AMPHETAMIN 10 MG TAB 04/06/2022 30 90 each Huston Foley, MD [facility] [phone] - R...  DEXTROAMP-AMPHETAMIN 10 MG TAB 02/27/2022 30 90 each Dohmeier, Porfirio Mylar, MD [facility] [phone] - R.Marland KitchenMarland Kitchen

## 2022-12-27 MED ORDER — AMPHETAMINE-DEXTROAMPHETAMINE 10 MG PO TABS: 10.0000 mg | ORAL_TABLET | Freq: Three times a day (TID) | ORAL | 0 refills | Status: DC | PRN

## 2023-01-31 IMAGING — CT CT RENAL STONE PROTOCOL
2 of 4 series · 16 of 46 positions shown, 18 images · non-contrast
Comparison: None.

CLINICAL DATA: Flank pain.  UTI 2 days ago.  Fever tonight.

EXAM:
CT ABDOMEN AND PELVIS WITHOUT CONTRAST
TECHNIQUE: Multidetector CT imaging of the abdomen and pelvis was performed
following the standard protocol without IV contrast.

[Series 2: axial st · axial · 0.94mm/px · z∈[-522,-72]mm · 13 of 102 slices shown, 15 images]
[im 6/102  soft-tissue]
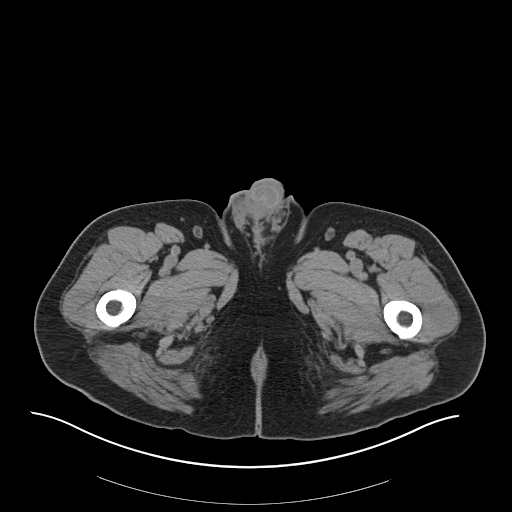
[im 6/102  bone]
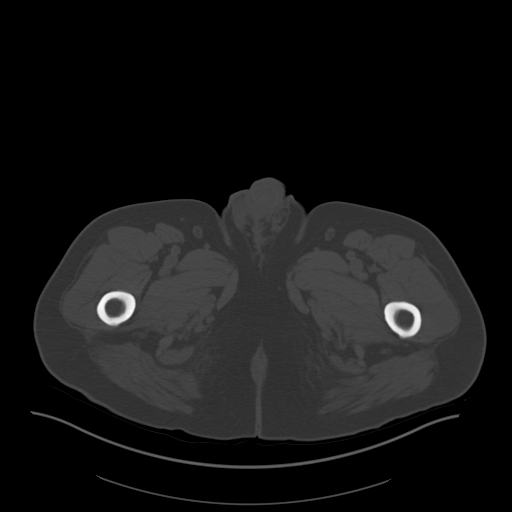
[im 12/102  soft-tissue]
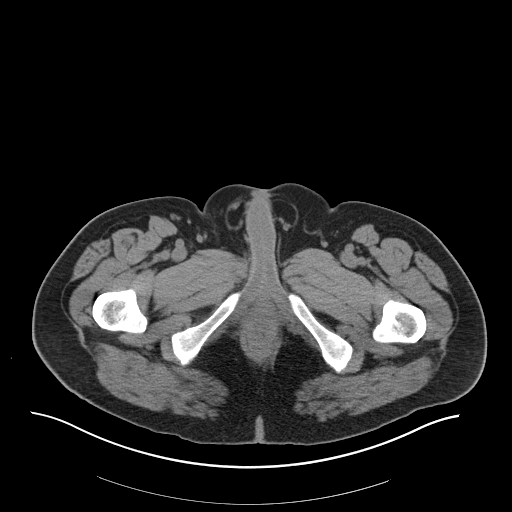
[im 23/102  soft-tissue]
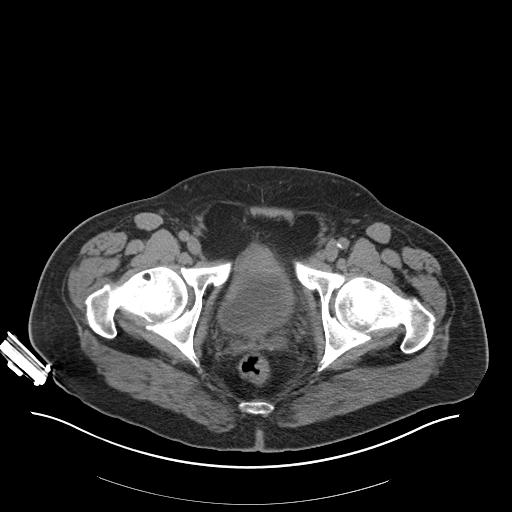
[im 29/102  soft-tissue]
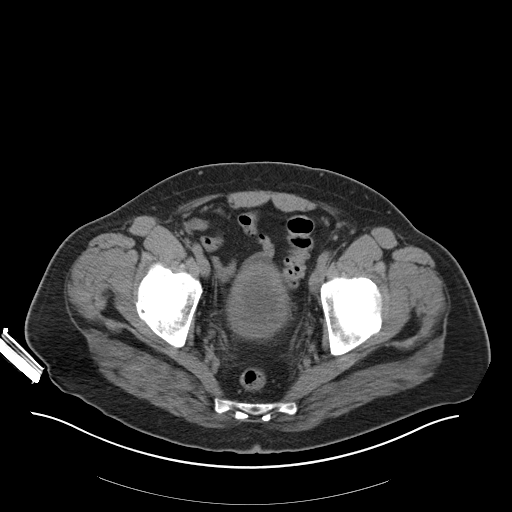
[im 34/102  soft-tissue]
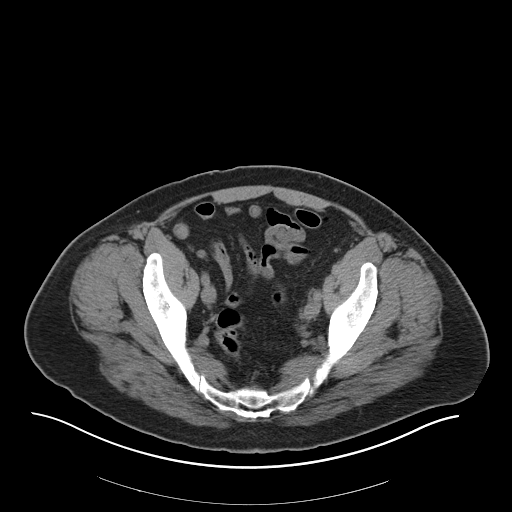
[im 45/102  soft-tissue]
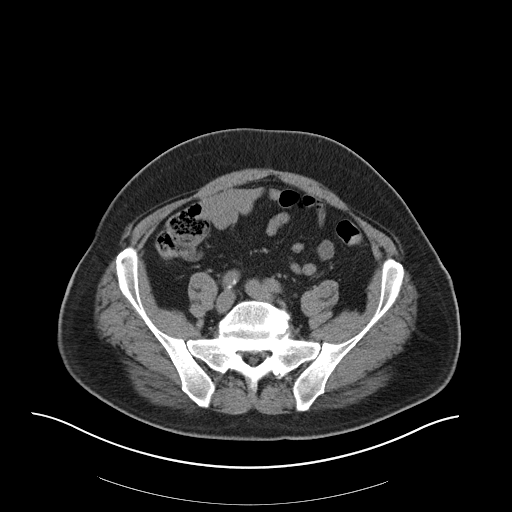
[im 51/102  soft-tissue]
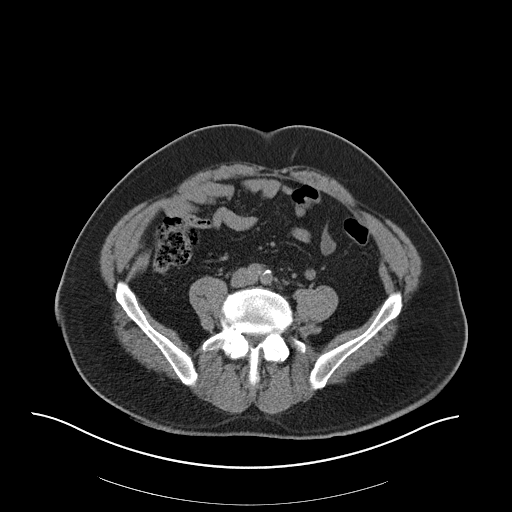
[im 57/102  soft-tissue]
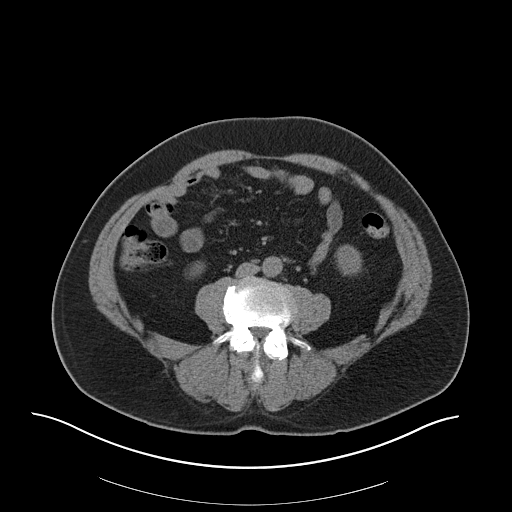
[im 68/102  soft-tissue]
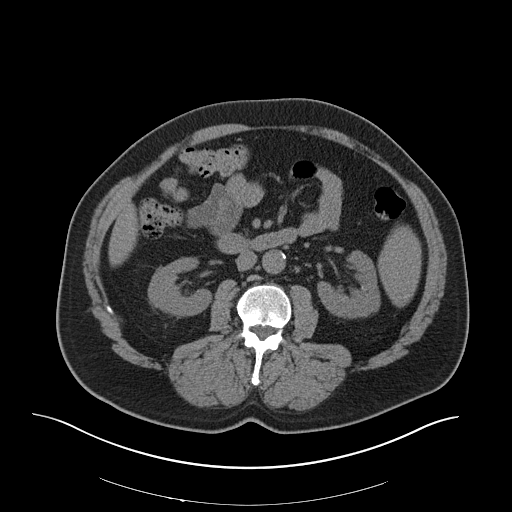
[im 68/102  bone]
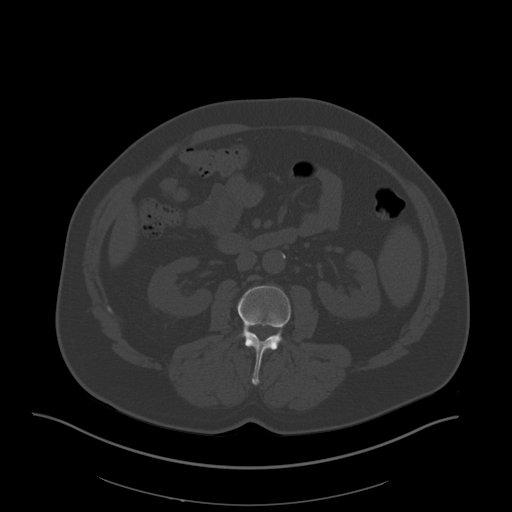
[im 73/102  soft-tissue]
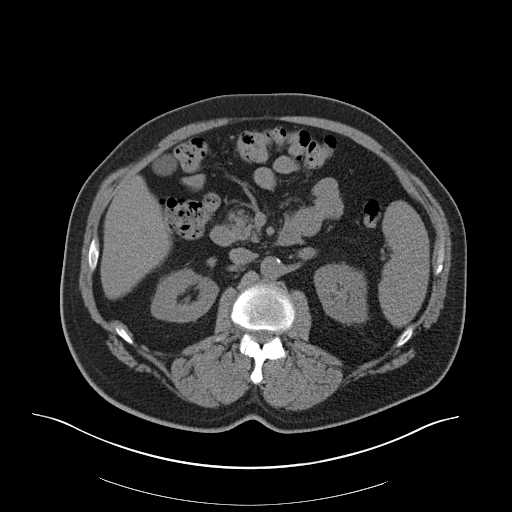
[im 79/102  soft-tissue]
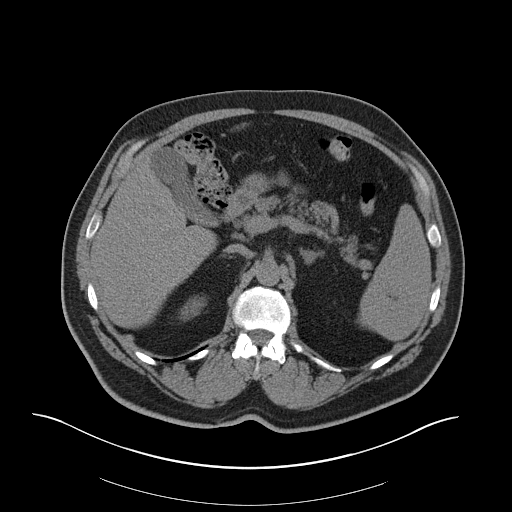
[im 90/102  soft-tissue]
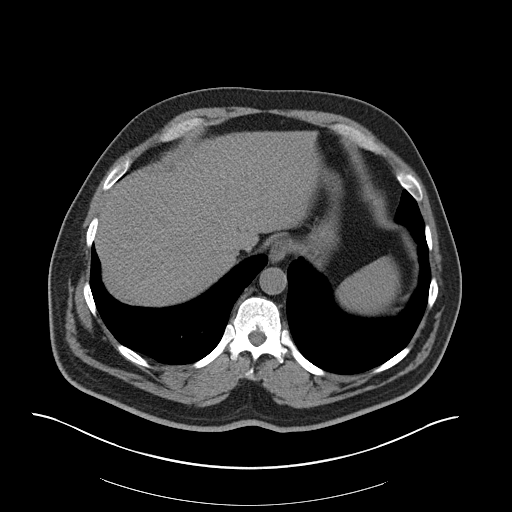
[im 96/102  soft-tissue]
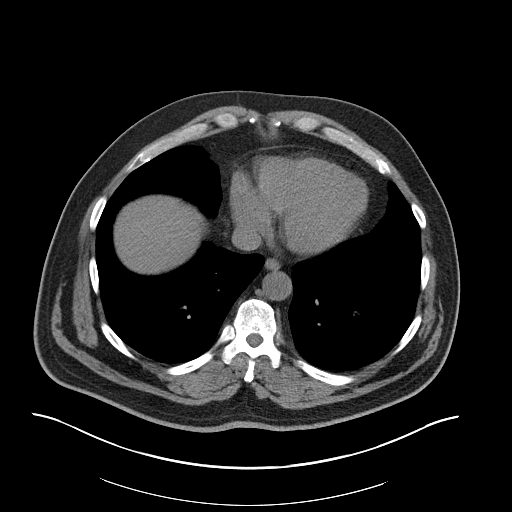

[Series 5: coronal · coronal · 0.86mm/px · 3 of 187 slices shown]
[im 63/187  soft-tissue]
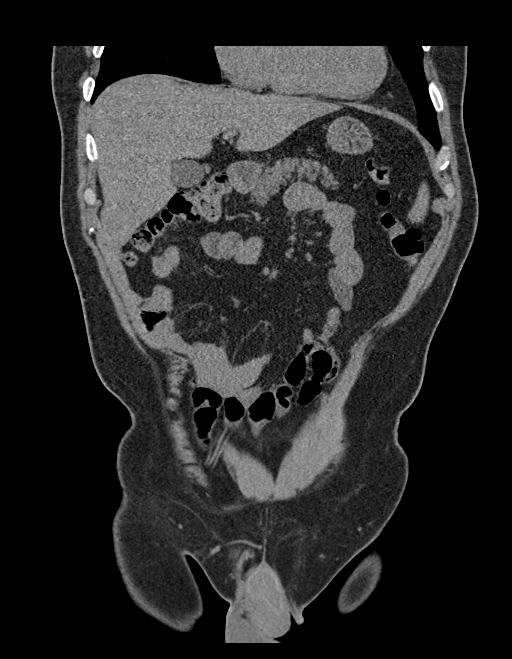
[im 83/187  soft-tissue]
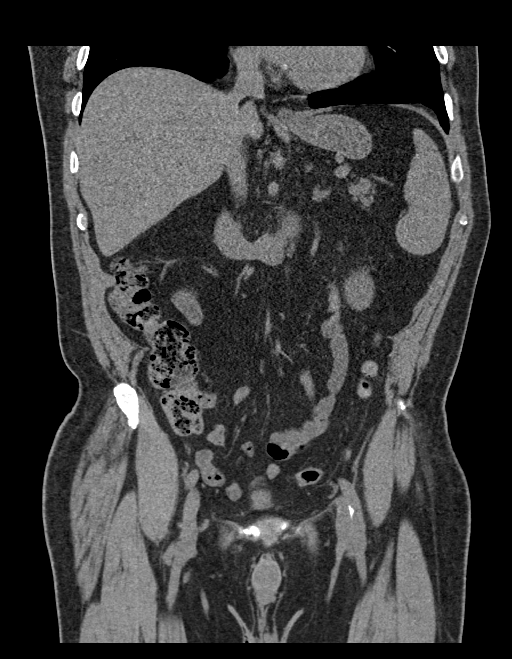
[im 104/187  soft-tissue]
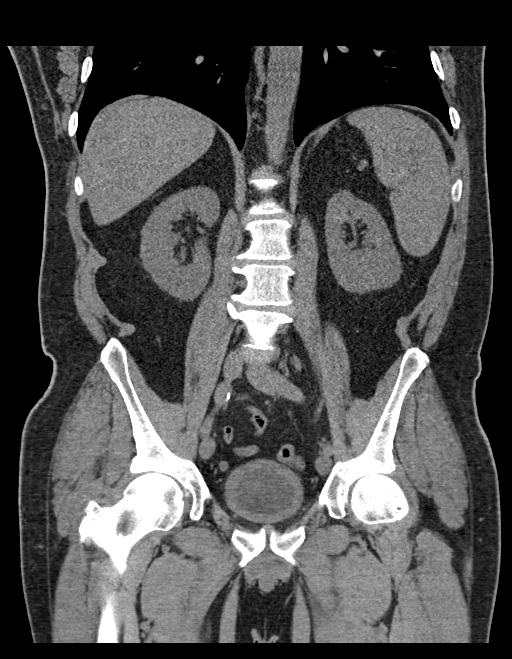

[16 of 46 positions shown; findings below may reference images not displayed]

FINDINGS: Lower chest: No acute abnormality.

Hepatobiliary: No focal liver abnormality is seen. Small layering
stones within the gallbladder. No pericholecystic fluid. No bile
duct dilatation is seen.

Pancreas: Unremarkable. No pancreatic ductal dilatation or
surrounding inflammatory changes.

Spleen: Normal in size without focal abnormality.

Adrenals/Urinary Tract: Adrenal glands are unremarkable. Kidneys are
unremarkable without stone or hydronephrosis. No perinephric fluid.
No ureteral or bladder calculi are identified. Bladder walls are
circumferentially thickened.

Stomach/Bowel: No dilated large or small bowel loops. No evidence of
bowel wall inflammation. Appendix is unremarkable. Stomach is
unremarkable, partially decompressed.

Vascular/Lymphatic: Mild aortic atherosclerosis. No enlarged lymph
nodes are seen.

Reproductive: Prostate is unremarkable.

Other: No abscess collection or free intraperitoneal air.

Musculoskeletal: No acute or suspicious osseous abnormality.
Degenerative spondylosis of the lumbar spine, mild to moderate in
degree. Bilateral inguinal hernias which contain fat only.
IMPRESSION: 1. Bladder walls are circumferentially thickened, consistent with
cystitis.
2. No renal or ureteral calculi. No hydronephrosis. No evidence of
pyelonephritis.
3. Cholelithiasis without evidence of acute cholecystitis.
4. Bilateral inguinal hernias which contain fat only.

Aortic Atherosclerosis ([id number]-HPL.L).

## 2023-02-03 ENCOUNTER — Other Ambulatory Visit: Payer: Self-pay | Admitting: Adult Health

## 2023-02-07 ENCOUNTER — Other Ambulatory Visit: Payer: Self-pay | Admitting: Adult Health

## 2023-02-09 ENCOUNTER — Telehealth: Payer: Self-pay | Admitting: Adult Health

## 2023-02-09 ENCOUNTER — Other Ambulatory Visit: Payer: Self-pay | Admitting: Neurology

## 2023-02-09 ENCOUNTER — Encounter: Payer: Self-pay | Admitting: Neurology

## 2023-02-09 MED ORDER — AMPHETAMINE-DEXTROAMPHETAMINE 10 MG PO TABS: 10.0000 mg | ORAL_TABLET | Freq: Three times a day (TID) | ORAL | 0 refills | Status: DC | PRN

## 2023-02-09 NOTE — Telephone Encounter (Signed)
Pt would like a refill of amphetamine-dextroamphetamine (ADDERALL) 10 MG tablet

## 2023-02-09 NOTE — Telephone Encounter (Signed)
Called pt and inform him his Rx refill request has been sent to his pharmacy.

## 2023-02-09 NOTE — Telephone Encounter (Signed)
Pt checking on refill request sent through MyChart for amphetamine-dextroamphetamine (ADDERALL) 10 MG tablet.

## 2023-03-08 ENCOUNTER — Encounter: Payer: Self-pay | Admitting: Adult Health

## 2023-03-08 ENCOUNTER — Ambulatory Visit (INDEPENDENT_AMBULATORY_CARE_PROVIDER_SITE_OTHER): Payer: BC Managed Care – PPO | Admitting: Adult Health

## 2023-03-08 VITALS — BP 111/51 | HR 62 | Ht 71.0 in | Wt 258.2 lb

## 2023-03-08 DIAGNOSIS — Z87898 Personal history of other specified conditions: Secondary | ICD-10-CM

## 2023-03-08 DIAGNOSIS — G4733 Obstructive sleep apnea (adult) (pediatric): Secondary | ICD-10-CM

## 2023-03-08 MED ORDER — AMPHETAMINE-DEXTROAMPHETAMINE 10 MG PO TABS
10.0000 mg | ORAL_TABLET | Freq: Three times a day (TID) | ORAL | 0 refills | Status: DC | PRN
Start: 2023-03-11 — End: 2023-04-12

## 2023-03-08 NOTE — Patient Instructions (Addendum)
Your Plan:  Continue nightly use of CPAP for sleep apnea management  Continue Adderall 10mg  three times daily - will place refill today  If you notice any further decline in your memory, would recommend follow up with Dr. Gibson Ramp (neuropsychology) as they can complete a neurocognitive evaluation for further evaluate memory concerns      Follow up in 1 year or call earlier if needed      Thank you for coming to see [place] at [facility]. I hope we have been able to provide you high quality care today.  You may receive a patient satisfaction survey over the next few weeks. We would appreciate your feedback and comments so that we may continue to improve ourselves and the health of our patients.

## 2023-03-08 NOTE — Progress Notes (Signed)
[name] [address] [place]. [name] [phone]       OFFICE FOLLOW UP NOTE  [name] Date of Birth:  1965-11-15 Medical Record Number:  [id number]    Primary neurologist: Dr. Vickey Huger Reason for visit: OSA on BiPAP, memory concerns    SUBJECTIVE:  CHIEF COMPLAINT:  Chief Complaint  Patient presents with   Follow-up    Pt in 3  Pt here for CPAP amd memory f/u Pt states no changes in memory since last visit and no changes with cpap Pt states had mri after last OV     Follow-up visit:  Prior visit: 09/22/2022 Dr. Vickey Huger  Brief HPI:   Kyle Carter is a 57 y.o. male who is followed in this office for OSA on BiPAP. CPAP titration 01/2021 which showed moderate to severe OSA with adequate treatment under BiPAP with set up date 03/05/2022.   Follow-up visit with Dr. Vickey Huger in 08/2022 showed compliant nightly usage although lower greater than 4-hour usage, noted residual AHI 7.0.  Noted continued elevated ESS at 14/24 but noted significant improvement of fatigue on Adderall He was then seen by Dr. Vickey Huger in 09/2022 for cognitive concerns with progression over the past 2 years.  Also noted staring episodes with unresponsiveness to verbal stimulus or touch.  Noted history of excessive EtOH intake but abstinent over the past year.  Recommended completion of Alzheimer's biomarkers, multiple other labs to rule out reversible causes, MRI brain and EEG.     Interval history:  Patient unaccompanied today. Believes his memory has improved since prior visit, denies any major memory concerns at this time. Denies any recent starring episodes that he is aware of. He has returned back to driving without difficulty. 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 Further extensive work up including MRI brain, EEG and lab unremarkable.   Reports tolerating CPAP  well. At times, may not sleep more than 3-4 hours which makes >4 hr compliance lower. Continued fatigue but continued benefit with Adderall 10mg  TID. ESS 10/24. Followed by DME [facility], up to date on supplies. No further questions or concerns today.      ROS:   14 system review of systems performed and negative with exception of those listed in HPI  PMH:  Past Medical History:  Diagnosis Date   ADHD    Anxiety    Depression    Hyperlipidemia    Hypertension    Sleep apnea     PSH:  Past Surgical History:  Procedure Laterality Date   COLONOSCOPY W/ POLYPECTOMY      Social History:  Social History   Socioeconomic History   Marital status: Married    Spouse name: Not on file   Number of children: Not on file   Years of education: Not on file   Highest education level: Not on file  Occupational History   Not on file  Tobacco Use   Smoking status: Never   Smokeless tobacco: Never  Vaping Use   Vaping status: Never Used  Substance and Sexual Activity   Alcohol use: Yes    Comment: 2-3 times per wk, unsure of amount   Drug use: Not Currently   Sexual activity: Yes  Other Topics Concern   Not on file  Social History Narrative   Lives with wife    Works at [employer]   Financial Resource Strain: Not on file  Food Insecurity: Not on file  Transportation Needs: Not on file  Physical Activity: Not on file  Stress: Not on file  Social Connections: Unknown (03/08/2022)   Received from [facility], [facility]   Social Network    Social Network: Not on file  Intimate Partner Violence: Unknown (03/08/2022)   Received from [facility], [facility]   HITS    Physically Hurt: Not on file    Insult or Talk Down To: Not on file    Threaten Physical Harm: Not on file    Scream or Curse: Not on file    Family History:  Family History  Problem Relation Age of Onset   Diabetes Mother    Liver cancer Mother    Diabetes Father     Sleep apnea Neg Hx    Alzheimer's disease Neg Hx    Dementia Neg Hx     Medications:   Current Outpatient Medications on File Prior to Visit  Medication Sig Dispense Refill   amLODipine (NORVASC) 5 MG tablet Take 5 mg by mouth in the morning.     amphetamine-dextroamphetamine (ADDERALL) 10 MG tablet Take 1 tablet (10 mg total) by mouth 3 (three) times daily as needed. 90 tablet 0   atenolol (TENORMIN) 100 MG tablet Take 100 mg by mouth every morning.     atorvastatin (LIPITOR) 10 MG tablet Take 10 mg by mouth every morning.     busPIRone (BUSPAR) 10 MG tablet Take 10 mg by mouth 2 (two) times daily.     lamoTRIgine (LAMICTAL) 25 MG tablet Take 25 mg by mouth daily.     sildenafil (VIAGRA) 100 MG tablet Take 100 mg by mouth daily as needed for erectile dysfunction.     zolpidem (AMBIEN) 10 MG tablet Take 10 mg by mouth at bedtime as needed for sleep.     escitalopram (LEXAPRO) 10 MG tablet Take 1 tablet (10 mg total) by mouth daily. 30 tablet 0   No current facility-administered medications on file prior to visit.    Allergies:  No Known Allergies    OBJECTIVE:  Physical Exam  Vitals:   03/08/23 1123  BP: (!) 111/51  Pulse: 62  Weight: 258 lb 3.2 oz (117.1 kg)  Height: 5\' 11"  (1.803 m)   Body mass index is 36.01 kg/m. No results found.  General: well developed, well nourished, pleasant middle aged male, seated, in no evident distress  Neurologic Exam Mental Status: Awake and fully alert. Oriented to place and time. Recent and remote memory intact during visit. Attention span, concentration and fund of knowledge appropriate during visit. Mood and affect appropriate.  Cranial Nerves: Pupils equal, briskly reactive to light. Extraocular movements full without nystagmus. Visual fields full to confrontation. Hearing intact. Facial sensation intact. Face, tongue, palate moves normally and symmetrically.  Motor: Normal bulk and tone. Normal strength in all tested extremity  muscles Gait and Station: Arises from chair without difficulty. Stance is normal. Gait demonstrates normal stride length and balance without use of AD.  Reflexes: 1+ and symmetric. Toes downgoing.         ASSESSMENT/PLAN: PACER WEHR is a 57 y.o. year old male      OSA on BiPAP:  Daytime fatigue CPAP shows excellent nightly compliance although suboptimal >4 hrs usage as he may not sleep longer than 3-4 hrs at times.  Discussed importance of ensuring greater than 4 hours per night for optimal benefit.  Continue current pressure settings.  Continue to follow with DME for supplies and any CPAP related concerns Continue Adderall 10mg  TID - refill provided today (last filled 02/09/2023 for 30 day supply)  Memory loss: Reports improvement, no specific concerns today Continue to monitor, if notices any worsening, would recommend follow up with neuropsychology Dr. Gibson Ramp (was seen on 09/16/2022 previously) for neurocognitive evaluation  MR brain 10/2022 (completed at [facility]) normal EEG 10/2022 normal Alzheimer's biomarkers negative Lab work for reversible causes negative    Follow up in 1 year or call earlier if needed   CC:  PCP: Nonnie Done., MD    I spent 30 minutes of face-to-face and non-face-to-face time with patient.  This included previsit chart review, lab review, study review, order entry, electronic health record documentation, patient education and discussion regarding above diagnoses and treatment plan and answered all other questions to patient's satisfaction  Ihor Austin, [facility]  [facility] [address] [place], [place] [address]  Phone [phone] Fax [phone] Note: This document was prepared with digital dictation and possible smart phrase technology. Any transcriptional errors that result from this process are unintentional.

## 2023-03-22 ENCOUNTER — Emergency Department (HOSPITAL_COMMUNITY)
Admission: EM | Admit: 2023-03-22 | Discharge: 2023-03-23 | Disposition: A | Discharge status: Home / Self Care | Payer: BC Managed Care – PPO | Attending: Emergency Medicine | Admitting: Emergency Medicine

## 2023-03-22 ENCOUNTER — Encounter (HOSPITAL_COMMUNITY): Payer: Self-pay | Admitting: *Deleted

## 2023-03-22 ENCOUNTER — Ambulatory Visit (HOSPITAL_COMMUNITY)
Admission: EM | Admit: 2023-03-22 | Discharge: 2023-03-22 | Disposition: A | Discharge status: Home / Self Care | Payer: BC Managed Care – PPO | Attending: Family Medicine | Admitting: Family Medicine

## 2023-03-22 ENCOUNTER — Emergency Department (HOSPITAL_COMMUNITY): Payer: BC Managed Care – PPO

## 2023-03-22 ENCOUNTER — Encounter (HOSPITAL_COMMUNITY): Payer: Self-pay | Admitting: Emergency Medicine

## 2023-03-22 ENCOUNTER — Other Ambulatory Visit: Payer: Self-pay

## 2023-03-22 DIAGNOSIS — Z79899 Other long term (current) drug therapy: Secondary | ICD-10-CM | POA: Diagnosis not present

## 2023-03-22 DIAGNOSIS — R072 Precordial pain: Secondary | ICD-10-CM | POA: Insufficient documentation

## 2023-03-22 DIAGNOSIS — R0602 Shortness of breath: Secondary | ICD-10-CM | POA: Diagnosis not present

## 2023-03-22 DIAGNOSIS — I10 Essential (primary) hypertension: Secondary | ICD-10-CM | POA: Diagnosis not present

## 2023-03-22 DIAGNOSIS — R079 Chest pain, unspecified: Secondary | ICD-10-CM | POA: Diagnosis present

## 2023-03-22 LAB — TROPONIN I (HIGH SENSITIVITY)
Troponin I (High Sensitivity): 3 ng/L (ref <18)
Troponin I (High Sensitivity): 3 ng/L (ref <18)

## 2023-03-22 LAB — BASIC METABOLIC PANEL
Anion gap: 10 (ref 5–15)
BUN: 18 mg/dL (ref 6–20)
CO2: 25 mmol/L (ref 22–32)
Calcium: 9.1 mg/dL (ref 8.9–10.3)
Chloride: 101 mmol/L (ref 98–111)
Creatinine, Ser: 0.95 mg/dL (ref 0.61–1.24)
GFR, Estimated: >60 mL/min (ref >60)
Glucose, Bld: 127 mg/dL — ABNORMAL HIGH (ref 70–99)
Potassium: 3.7 mmol/L (ref 3.5–5.1)
Sodium: 136 mmol/L (ref 135–145)

## 2023-03-22 LAB — CBC WITH DIFFERENTIAL/PLATELET
Abs Immature Granulocytes: 0.04 10*3/uL (ref 0.00–0.07)
Basophils Absolute: 0 10*3/uL (ref 0.0–0.1)
Basophils Relative: 1 %
Eosinophils Absolute: 0.1 10*3/uL (ref 0.0–0.5)
Eosinophils Relative: 1 %
HCT: 43.8 % (ref 39.0–52.0)
Hemoglobin: 15.2 g/dL (ref 13.0–17.0)
Immature Granulocytes: 1 %
Lymphocytes Relative: 23 %
Lymphs Abs: 1.8 10*3/uL (ref 0.7–4.0)
MCH: 29.7 pg (ref 26.0–34.0)
MCHC: 34.7 g/dL (ref 30.0–36.0)
MCV: 85.5 fL (ref 80.0–100.0)
Monocytes Absolute: 0.6 10*3/uL (ref 0.1–1.0)
Monocytes Relative: 7 %
Neutro Abs: 5.1 10*3/uL (ref 1.7–7.7)
Neutrophils Relative %: 67 %
Platelets: 242 10*3/uL (ref 150–400)
RBC: 5.12 MIL/uL (ref 4.22–5.81)
RDW: 13.7 % (ref 11.5–15.5)
WBC: 7.5 10*3/uL (ref 4.0–10.5)
nRBC: 0 % (ref 0.0–0.2)

## 2023-03-22 MED ORDER — IOHEXOL 350 MG/ML SOLN
75.0000 mL | Freq: Once | INTRAVENOUS | Status: AC | PRN
  Administered 2023-03-22: 75 mL via INTRAVENOUS

## 2023-03-22 NOTE — ED Triage Notes (Signed)
Reports intermittent left side chest pain x 4-5 days with lightheadedness and SOB. Alert. No acute distress

## 2023-03-22 NOTE — ED Triage Notes (Signed)
Patient reports intermittent left chest pain with SOB for 4 days , no emesis or diaphoresis , sent here from urgent care for treatment .

## 2023-03-22 NOTE — ED Provider Triage Note (Signed)
Emergency Medicine Provider Triage Evaluation Note  Kyle Carter , a 57 y.o. male  was evaluated in triage.  Pt complains of chest pain for 4-5 days. Seen at urgent care and sent to the ED for additional evaluation. ECG obtained at [facility] reveals sinus brady, t wave inversion in lead III. Patient also reports some shortness of breath. Pain is intermittent in nature. No pain currently  Review of Systems  Positive: Chest pain, shortness of breath Negative: Nausea, vomiting, abdominal pain, diaphoresis  Physical Exam  BP (!) 165/93 (BP Location: Right Arm)   Pulse (!) 55   Temp 98 F (36.7 C)   Resp 18   SpO2 96%  Gen:   Awake, no distress   Resp:  Normal effort, CTA bilaterally MSK:   Moves extremities without difficulty  Other:  No lower extremity edema  Medical Decision Making  Medically screening exam initiated at 8:28 PM.  Appropriate orders placed.  [name] was informed that the remainder of the evaluation will be completed by another provider, this initial triage assessment does not replace that evaluation, and the importance of remaining in the ED until their evaluation is complete.     Kyle Morn, NP 03/22/23 2035

## 2023-03-22 NOTE — ED Provider Notes (Signed)
Patient seen briefly in triage. For the last 4 to 5 days he has had intermittent left-sided chest pain that radiates to his back.  It would last up to 5 minutes at a time and he will have it several times a day.  Associated with this pain he will have some lightheadedness and shortness of breath.  No fever or cough or congestion  He is not currently hurting now  He is in no distress in the exam room.  CV regular rate and rhythm without murmur heard  EKG shows some upsloping on the T waves in V2 and V3 and also in lead II.  Lead III has some T wave inversion . Since he is not currently hurting we are not going to send in by ambulance.  But there is enough in his history and on his EKG that is just not normal that I want him to be evaluated in the emergency room on an urgent basis.  His wife will drive him there in a private car.   Zenia Resides, MD 03/22/23 [phone]

## 2023-03-22 NOTE — ED Provider Notes (Signed)
[place] EMERGENCY DEPARTMENT AT [facility] Provider Note   CSN: [id number] Arrival date & time: 03/22/23  1945     History  Chief Complaint  Patient presents with   Chest Pain    Kyle Carter is a 57 y.o. male.  Patient with about a 4-day history of exertional shortness of breath made much worse by going up the stairs.  Also some intermittent left anterior chest pain that only lasts for a minute or 2 and goes away for about an hour.  Does radiate to the left back area.  No diaphoresis no nausea no vomiting.  No history of any known coronary problems.  Past medical history significant for hypertension and hyperlipidemia those would be risk factors.  Patient's never used tobacco products.  Patient was seen at [facility] urgent care and then referred in.  EKG without acute changes.  No real respiratory symptoms no fever.  Oxygen sats on room air 100%.  Denies any leg swelling.  Does have bilateral knee pain he is looking at bilateral knee replacements.       Home Medications Prior to Admission medications   Medication Sig Start Date End Date Taking? Authorizing Provider  amLODipine (NORVASC) 5 MG tablet Take 5 mg by mouth in the Carter. 12/03/20   [provider]  amphetamine-dextroamphetamine (ADDERALL) 10 MG tablet Take 1 tablet (10 mg total) by mouth 3 (three) times daily as needed. 03/11/23   Ihor Austin, NP  atenolol (TENORMIN) 100 MG tablet Take 100 mg by mouth every Carter.    [provider]  atorvastatin (LIPITOR) 10 MG tablet Take 10 mg by mouth every Carter.    [provider]  busPIRone (BUSPAR) 10 MG tablet Take 10 mg by mouth 2 (two) times daily.    [provider]  escitalopram (LEXAPRO) 10 MG tablet Take 1 tablet (10 mg total) by mouth daily. 10/18/21 03/22/23  Park Pope, MD  lamoTRIgine (LAMICTAL) 25 MG tablet Take 25 mg by mouth daily. 08/09/22   [provider]  sildenafil (VIAGRA) 100 MG tablet Take 100 mg  by mouth daily as needed for erectile dysfunction. 05/22/21   [provider]  zolpidem (AMBIEN) 10 MG tablet Take 10 mg by mouth at bedtime as needed for sleep.    [provider]      Allergies    Patient has no known allergies.    Review of Systems   Review of Systems  Constitutional:  Negative for chills and fever.  HENT:  Negative for ear pain and sore throat.   Eyes:  Negative for pain and visual disturbance.  Respiratory:  Positive for shortness of breath. Negative for cough.   Cardiovascular:  Positive for chest pain. Negative for palpitations.  Gastrointestinal:  Negative for abdominal pain and vomiting.  Genitourinary:  Negative for dysuria and hematuria.  Musculoskeletal:  Negative for arthralgias and back pain.  Skin:  Negative for color change and rash.  Neurological:  Negative for seizures and syncope.  All other systems reviewed and are negative.   Physical Exam Updated Vital Signs BP 138/75   Pulse (!) 54   Temp 98 F (36.7 C)   Resp 16   SpO2 100%  Physical Exam Vitals and nursing note reviewed.  Constitutional:      General: He is not in acute distress.    Appearance: Normal appearance. He is well-developed.  HENT:     Head: Normocephalic and atraumatic.  Eyes:     Extraocular  Movements: Extraocular movements intact.     Conjunctiva/sclera: Conjunctivae normal.     Pupils: Pupils are equal, round, and reactive to light.  Cardiovascular:     Rate and Rhythm: Normal rate and regular rhythm.     Heart sounds: No murmur heard. Pulmonary:     Effort: Pulmonary effort is normal. No respiratory distress.     Breath sounds: Normal breath sounds. No wheezing, rhonchi or rales.  Abdominal:     Palpations: Abdomen is soft.     Tenderness: There is no abdominal tenderness.  Musculoskeletal:        General: No swelling.     Cervical back: Normal range of motion and neck supple.     Right lower leg: No edema.     Left lower leg: No edema.   Skin:    General: Skin is warm and dry.     Capillary Refill: Capillary refill takes less than 2 seconds.  Neurological:     General: No focal deficit present.     Mental Status: He is alert and oriented to person, place, and time.  Psychiatric:        Mood and Affect: Mood normal.     ED Results / Procedures / Treatments   Labs (all labs ordered are listed, but only abnormal results are displayed) Labs Reviewed  BASIC METABOLIC PANEL - Abnormal; Notable for the following components:      Result Value   Glucose, Bld 127 (*)    All other components within normal limits  CBC WITH DIFFERENTIAL/PLATELET  CBG MONITORING, ED  TROPONIN I (HIGH SENSITIVITY)  TROPONIN I (HIGH SENSITIVITY)    EKG None  Radiology DG Chest 2 View Result Date: 03/22/2023 CLINICAL DATA:  Chest pain and shortness of breath EXAM: CHEST - 2 VIEW COMPARISON:  Chest radiograph dated 04/26/2008. FINDINGS: Faint density at the left lung base may represent atelectasis or infiltrate. No focal consolidation, pleural effusion, or pneumothorax. Stable cardiac silhouette. No acute osseous pathology. IMPRESSION: Left basilar atelectasis or infiltrate. Electronically Signed   By: Elgie Collard M.D.   On: 03/22/2023 21:41    Procedures Procedures    Medications Ordered in ED Medications - No data to display  ED Course/ Medical Decision Making/ A&P                                 Medical Decision Making Amount and/or Complexity of Data Reviewed Radiology: ordered.   Workup here basic metabolic panel renal function normal.  First troponin very normal at 3.  But does need a delta troponin.  CBC no leukocytosis hemoglobin 15.2 platelets 242.  Chest x-ray left basilar atelectasis or infiltrate.  Clinically no white count seems not likely he is got a respiratory infection.  But with his exertional shortness of breath and the left anterior chest pain going to the back and that being intermittent.  Will go ahead and  get CT angio chest to find that left basilar area and to rule out pulmonary embolus.  Clinically very suspicious possibly for PE.   Final Clinical Impression(s) / ED Diagnoses Final diagnoses:  Precordial pain  Shortness of breath    Rx / DC Orders ED Discharge Orders     None         Vanetta Mulders, MD 03/22/23 2250

## 2023-03-22 NOTE — ED Notes (Signed)
Patient is being discharged from the Urgent Care and sent to the Emergency Department via POV . Per Dr. Roseanne Reno, patient is in need of higher level of care due to evaluation of chest pain. Patient is aware and verbalizes understanding of plan of care.  Vitals:   03/22/23 1919  BP: (!) 147/81  Pulse: (!) 52  Resp: 18  Temp: 98.4 F (36.9 C)  SpO2: 98%

## 2023-03-23 NOTE — Discharge Instructions (Signed)

## 2023-03-23 NOTE — ED Provider Notes (Signed)
I assumed care at signout.  Patient was having chest pain with some shortness of breath. He is pain-free at this time.  2 troponins are negative. Repeat EKG reveals isolated T wave inversion, but no other acute findings.  CT chest negative for PE Patient is never had these issues before.  Will give a urgent referral to cardiology we discussed strict return precaution.   EKG Interpretation Date/Time:  Wednesday March 23 2023 00:52:13 EST Ventricular Rate:  52 PR Interval:  185 QRS Duration:  100 QT Interval:  446 QTC Calculation: 415 R Axis:   33  Text Interpretation: Sinus rhythm isolated t wave inversion noted No significant change since last tracing Confirmed by Zadie Rhine ([id number]) on 03/23/2023 12:53:17 AM          Zadie Rhine, MD 03/23/23 [phone]

## 2023-03-23 NOTE — ED Notes (Signed)
Pt ambulated to restroom with steady gait.

## 2023-03-28 ENCOUNTER — Ambulatory Visit: Payer: BC Managed Care – PPO | Admitting: Adult Health

## 2023-04-12 ENCOUNTER — Other Ambulatory Visit: Payer: Self-pay

## 2023-04-12 ENCOUNTER — Other Ambulatory Visit: Payer: Self-pay | Admitting: Adult Health

## 2023-04-12 DIAGNOSIS — G4733 Obstructive sleep apnea (adult) (pediatric): Secondary | ICD-10-CM

## 2023-04-12 MED ORDER — AMPHETAMINE-DEXTROAMPHETAMINE 10 MG PO TABS: 10.0000 mg | ORAL_TABLET | Freq: Three times a day (TID) | ORAL | 0 refills | Status: DC | PRN

## 2023-04-12 NOTE — Telephone Encounter (Signed)
 Pt Last Seen 03/08/2023 Upcoming Appointment 08/11/2023  Adderall Last filled 03/11/2023 Escript 04/12/2023

## 2023-04-12 NOTE — Telephone Encounter (Signed)
 Adderall last filled on 03/10/2023 for 30 day supply. Will sign refill request. Thank you.

## 2023-04-18 ENCOUNTER — Telehealth: Payer: Self-pay | Admitting: Neurology

## 2023-04-18 NOTE — Telephone Encounter (Signed)
 Spoke to wife (checked DPR) wife states medication Continue Adderall 10mg  TID - cap was not tight on medication bottle and all medication dissolved in sink in dish washing water . Pt needs new rx . Pt did have 2 tablets left from a previous month . Will forward to harlene ,NP to make aware

## 2023-04-18 NOTE — Telephone Encounter (Signed)
 Pt's wife Bartholome on [place] called wanting to speak with RN or MD about a mishap that happened to the pt's amphetamine -dextroamphetamine  (ADDERALL) 10 MG tablet  She states that when she picked it up apparently the lid was not on right and when she took it out of the bag the whole bottle of pills spilt out and were damaged. Bartholome states that she the ones she was able to catch wrapped up in a paper towel and will bring it to the office if need be, but the pt is needing a new refill request sent in but she states that per insurance the new Rx just for this month has to be sent in with a different qt or different mg in order to be filled. She would like a call back as soon as possible due to pt not being able to wait long without the medication. Please advise.

## 2023-04-19 MED ORDER — AMPHETAMINE-DEXTROAMPHETAMINE 5 MG PO TABS: 5.0000 mg | ORAL_TABLET | Freq: Three times a day (TID) | ORAL | 0 refills | Status: DC | PRN

## 2023-04-19 NOTE — Addendum Note (Signed)
 Addended by: Ihor Austin L on: 04/19/2023 07:37 AM   Modules accepted: Orders

## 2023-04-19 NOTE — Telephone Encounter (Signed)
 Spoke to wife (checked DPR) informed wife Jessica,NP did send in a new prescription of Adderall IR 5mg  tablets taking 1-2 tablets 3 times daily as needed. Can resume Adderall 10 mg tablets TID PRN next month. Informed wife rx did change due to insurance approval .wife expressed understanding and thanked me for calling

## 2023-04-19 NOTE — Telephone Encounter (Signed)
 Patient has never previously had incidents like this occur and per drug registry, he has been consistent with filling medication appropriately monthly. Will send in a new prescription of Adderall IR 5mg  tablets taking 1-2 tablets 3 times daily as needed. Can resume Adderall 10 mg tablets TID PRN next month. Please advise patient.

## 2023-04-27 ENCOUNTER — Encounter: Payer: Self-pay | Admitting: Adult Health

## 2023-04-28 MED ORDER — AMPHETAMINE-DEXTROAMPHETAMINE 5 MG PO TABS: 5.0000 mg | ORAL_TABLET | Freq: Three times a day (TID) | ORAL | 0 refills | Status: DC | PRN

## 2023-04-28 NOTE — Telephone Encounter (Signed)
Pt states pharmacy didn't receive refill I called pharmacy confirmed didn't receive refill for adderall . Resent rx  this am  to pharmacy pt aware

## 2023-05-19 NOTE — Telephone Encounter (Addendum)
Patient received a 30-day supply on 1/23 so this is too early to refill.  Please advise patient. Thank you.

## 2023-05-19 NOTE — Telephone Encounter (Signed)
Per 04/18/23 telephone encounter Adderall 5 mg take 1-2 tablets TID last filled on 04/28/23 #90 tablets  Rx pending to be signed

## 2023-05-30 MED ORDER — AMPHETAMINE-DEXTROAMPHETAMINE 10 MG PO TABS
10.0000 mg | ORAL_TABLET | Freq: Three times a day (TID) | ORAL | 0 refills | Status: DC
Start: 2023-05-30 — End: 2023-07-04

## 2023-05-30 NOTE — Addendum Note (Signed)
 Addended by: Judi Cong on: 05/30/2023 02:19 PM   Modules accepted: Orders

## 2023-06-09 ENCOUNTER — Telehealth: Payer: Self-pay

## 2023-06-09 NOTE — Telephone Encounter (Signed)
 Patient called in to advise that he went in to pick up his prescription for 10mg  adderall. When he went to go pick up it, they also had a refill of the 5 mg adderall ready for him. He wanted to make our office aware that he did not pick up the 5 mg rx and only picked up the 10 mg. He advised the pharmacy to put back the 5mg  tablets.

## 2023-06-09 NOTE — Telephone Encounter (Signed)
 error

## 2023-06-09 NOTE — Telephone Encounter (Signed)
 Noted. The 5mg  rx was cancelled as he returned back to the 10mg  tablets so not sure why the pharmacy the 5 mg tablets available unless it was from the prior prescription.  Either way, his med list is currently correct and should not have an issue with the 5mg  tablets moving forward. Thank you.

## 2023-07-04 ENCOUNTER — Other Ambulatory Visit: Payer: Self-pay | Admitting: Adult Health

## 2023-07-04 NOTE — Telephone Encounter (Signed)
Pt called wanting to know when this will be called in for him. Please advise.

## 2023-07-05 MED ORDER — AMPHETAMINE-DEXTROAMPHETAMINE 10 MG PO TABS: 10.0000 mg | ORAL_TABLET | Freq: Three times a day (TID) | ORAL | 0 refills | Status: DC

## 2023-08-06 ENCOUNTER — Other Ambulatory Visit: Payer: Self-pay | Admitting: Adult Health

## 2023-08-08 NOTE — Progress Notes (Unsigned)
 [name] [address] [place]. [place] [address] [phone]       OFFICE FOLLOW UP NOTE  [name] Date of Birth:  09/05/1965 Medical Record Number:  [id number]    Primary neurologist: Dr. Albertina Hugger Reason for visit: OSA on BiPAP    SUBJECTIVE:  CHIEF COMPLAINT:  Chief Complaint  Patient presents with   Follow-up    Pt in 3 alone  Pt here for cpap f/u Pt states no questions or concerns for todays visit Pt states need refill for adderall     Follow-up visit:  Prior visit: 03/08/2023  Brief HPI:   Kyle Carter is a 58 y.o. male who is followed in this office for OSA on BiPAP. CPAP titration 01/2021 which showed moderate to severe OSA with adequate treatment under BiPAP with set up date 03/05/2022.   Follow-up visit with Dr. Albertina Hugger in 08/2022 showed compliant nightly usage although lower greater than 4-hour usage, noted residual AHI 7.0.  Noted continued elevated ESS at 14/24 but noted significant improvement of fatigue on Adderall He was then seen by Dr. Albertina Hugger in 09/2022 for cognitive concerns with progression over the past 2 years.  Also noted staring episodes with unresponsiveness to verbal stimulus or touch.  Noted history of excessive EtOH intake but abstinent over the past year.  Recommended completion of Alzheimer's biomarkers, multiple other labs to rule out reversible causes, MRI brain and EEG.     Interval history:   Returns for follow-up visit.  Continues to tolerate CPAP well.  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  He has been trying to get to bed earlier but at times can be difficult.  He does report continued benefit with Adderall 10mg  TID.  ESS 9/24.  No specific cognitive or memory concerns today  He is scheduled to undergo total right knee replacement in June and reports eventually will need left knee  replaced.              ROS:   14 system review of systems performed and negative with exception of those listed in HPI  PMH:  Past Medical History:  Diagnosis Date   ADHD    Anxiety    Depression    Hyperlipidemia    Hypertension    Sleep apnea     PSH:  Past Surgical History:  Procedure Laterality Date   COLONOSCOPY W/ POLYPECTOMY      Social History:  Social History   Socioeconomic History   Marital status: Married    Spouse name: Not on file   Number of children: Not on file   Years of education: Not on file   Highest education level: Not on file  Occupational History   Not on file  Tobacco Use   Smoking status: Never   Smokeless tobacco: Never  Vaping Use   Vaping status: Never Used  Substance and Sexual Activity   Alcohol use: Not Currently    Comment: 2-3 times per wk, unsure of amount   Drug use: Not Currently   Sexual activity: Yes  Other Topics Concern   Not on file  Social History Narrative   Lives with wife    Works at [employer]    Social Drivers of Corporate investment banker Strain: Not on file  Food Insecurity: Not on file  Transportation Needs: Not on file  Physical Activity: Not on file  Stress: Not on file  Social Connections: Unknown (03/08/2022)   Received from [facility], [facility]   Social Network    Social Network: Not on file  Intimate Partner Violence: Unknown (03/08/2022)   Received from [facility], [facility]   HITS    Physically Hurt: Not on file    Insult or Talk Down To: Not on file    Threaten Physical Harm: Not on file    Scream or Curse: Not on file    Family History:  Family History  Problem Relation Age of Onset   Diabetes Mother    Liver cancer Mother    Diabetes Father    Sleep apnea Neg Hx    Alzheimer's disease Neg Hx    Dementia Neg Hx     Medications:   Current Outpatient Medications on File Prior to Visit  Medication Sig Dispense Refill   amLODipine  (NORVASC ) 5 MG tablet Take 5  mg by mouth in the morning.     amphetamine -dextroamphetamine  (ADDERALL) 10 MG tablet Take 1 tablet (10 mg total) by mouth in the morning, at noon, and at bedtime. 90 tablet 0   atenolol  (TENORMIN ) 100 MG tablet Take 100 mg by mouth every morning.     atorvastatin  (LIPITOR) 10 MG tablet Take 10 mg by mouth every morning.     busPIRone  (BUSPAR ) 10 MG tablet Take 10 mg by mouth 2 (two) times daily.     lamoTRIgine (LAMICTAL) 25 MG tablet Take 25 mg by mouth daily.     sildenafil (VIAGRA) 100 MG tablet Take 100 mg by mouth daily as needed for erectile dysfunction.     zolpidem  (AMBIEN ) 10 MG tablet Take 10 mg by mouth at bedtime as needed for sleep.     escitalopram  (LEXAPRO ) 10 MG tablet Take 1 tablet (10 mg total) by mouth daily. 30 tablet 0   No current facility-administered medications on file prior to visit.    Allergies:  No Known Allergies    OBJECTIVE:  Physical Exam  Vitals:   08/09/23 1317  BP: 115/64  Pulse: 61  Weight: 256 lb (116.1 kg)  Height: 5\' 11"  (1.803 m)    Body mass index is 35.7 kg/m. No results found.  General: well developed, well nourished, pleasant middle aged male, seated, in no evident distress  Neurologic Exam Mental Status: Awake and fully alert. Oriented to place and time. Recent and remote memory intact during visit. Attention span, concentration and fund of knowledge appropriate during visit. Mood and affect appropriate.  Cranial Nerves: Pupils equal, briskly reactive to light. Extraocular movements full without nystagmus. Visual fields full to confrontation. Hearing intact. Facial sensation intact. Face, tongue, palate moves normally and symmetrically.  Motor: Normal bulk and tone. Normal strength in all tested extremity muscles Gait and Station: Arises from chair without difficulty. Stance is normal. Gait demonstrates normal stride length and balance without use of AD.  Reflexes: 1+ and symmetric. Toes downgoing.         ASSESSMENT/PLAN:  Kyle Carter is a 58 y.o. year old male      OSA on BiPAP:  Daytime fatigue CPAP shows excellent nightly compliance although suboptimal >4 hrs usage as he may not sleep longer than 3-4 hrs at times.  Discussed importance of ensuring greater than 4 hours per night for optimal benefit.  Continue current pressure settings.  Continue to follow with DME for supplies and any CPAP related concerns Continue Adderall 10mg  TID -  refill provided today (last filled 07/05/2023 for 30 day supply)  Memory loss: Really improved, no specific concerns Continue to monitor, if notices any worsening, would recommend follow up with neuropsychology Dr. Michal Agar (was seen on 09/16/2022 previously) for neurocognitive evaluation  MR brain 10/2022 (completed at [facility]) normal EEG 10/2022 normal Alzheimer's biomarkers negative Lab work for reversible causes negative    Follow up in in December or call earlier if needed   CC:  PCP: Lucius Sabins., MD    I spent 30 minutes of face-to-face and non-face-to-face time with patient.  This included previsit chart review, lab review, study review, order entry, electronic health record documentation, patient education and discussion regarding above diagnoses and treatment plan and answered all other questions to patient's satisfaction  Johny Nap, [facility]  [facility] [address] [place], [place] [address]  Phone [phone] Fax [phone] Note: This document was prepared with digital dictation and possible smart phrase technology. Any transcriptional errors that result from this process are unintentional.

## 2023-08-09 ENCOUNTER — Ambulatory Visit (INDEPENDENT_AMBULATORY_CARE_PROVIDER_SITE_OTHER): Admitting: Adult Health

## 2023-08-09 ENCOUNTER — Encounter: Payer: Self-pay | Admitting: Adult Health

## 2023-08-09 VITALS — BP 115/64 | HR 61 | Ht 71.0 in | Wt 256.0 lb

## 2023-08-09 DIAGNOSIS — G4733 Obstructive sleep apnea (adult) (pediatric): Secondary | ICD-10-CM

## 2023-08-09 DIAGNOSIS — Z87898 Personal history of other specified conditions: Secondary | ICD-10-CM | POA: Diagnosis not present

## 2023-08-09 MED ORDER — AMPHETAMINE-DEXTROAMPHETAMINE 10 MG PO TABS: 10.0000 mg | ORAL_TABLET | Freq: Three times a day (TID) | ORAL | 0 refills | Status: DC

## 2023-08-09 NOTE — Patient Instructions (Addendum)
 Your Plan:  Continue nightly use of CPAP with greater than 4 hours per night for optimal benefit and per insurance requirements  Continue to follow with your DME company [facility] for any needs supplies or CPAP related concerns  Continue Adderall 10mg  three times daily      Follow-up in December or call earlier if needed     Thank you for coming to see us  at [facility]. I hope we have been able to provide you high quality care today.  You may receive a patient satisfaction survey over the next few weeks. We would appreciate your feedback and comments so that we may continue to improve ourselves and the health of our patients.

## 2023-08-11 ENCOUNTER — Ambulatory Visit: Payer: BC Managed Care – PPO | Admitting: Adult Health

## 2023-09-27 ENCOUNTER — Encounter: Payer: Self-pay | Admitting: Adult Health

## 2023-09-28 MED ORDER — AMPHETAMINE-DEXTROAMPHETAMINE 10 MG PO TABS: 10.0000 mg | ORAL_TABLET | Freq: Three times a day (TID) | ORAL | 0 refills | Status: DC

## 2023-09-28 NOTE — Telephone Encounter (Signed)
 Requested Prescriptions   Pending Prescriptions Disp Refills   amphetamine -dextroamphetamine  (ADDERALL) 10 MG tablet 90 tablet 0    Sig: Take 1 tablet (10 mg total) by mouth in the morning, at noon, and at bedtime.   Last seen 08/09/23, next appt 03/07/24  Dispenses   Dispensed Days Supply Quantity Provider Pharmacy  DEXTROAMP-AMPHETAMIN 10 MG TAB 08/09/2023 30 90 each Whitfield Raisin, NP [facility] [phone] - R...  DEXTROAMP-AMPHETAMIN 10 MG TAB 07/05/2023 30 90 each Whitfield Raisin, NP [facility] [phone] - R...  DEXTROAMP-AMPHETAMIN 10 MG TAB 05/30/2023 30 90 each Whitfield Raisin, NP [facility] [phone] - R...  DEXTROAMP-AMPHETAMINE  5 MG TAB 04/28/2023 30 180 each Dohmeier, Dedra, MD [facility] [phone] - R...  DEXTROAMP-AMPHETAMIN 10 MG TAB 04/12/2023 30 90 each Whitfield Raisin, NP [facility] [phone] - R...  DEXTROAMP-AMPHETAMIN 10 MG TAB 03/10/2023 30 90 each Whitfield Raisin, NP [facility] [phone] - R...  DEXTROAMP-AMPHETAMIN 10 MG TAB 02/09/2023 30 90 each Dohmeier, Dedra, MD [facility] [phone] - R...  DEXTROAMP-AMPHETAMIN 10 MG TAB 12/27/2022 30 90 each Whitfield Raisin, NP [facility] [phone] - R...  DEXTROAMP-AMPHETAMIN 10 MG TAB 10/31/2022 30 90 each Dohmeier, Dedra, MD [facility] [phone] - R...    Last note stated: Continue Adderall 10mg  three times daily

## 2023-10-28 ENCOUNTER — Other Ambulatory Visit: Payer: Self-pay | Admitting: Adult Health

## 2023-10-28 NOTE — Telephone Encounter (Signed)
 Pt called to request medication refill  amphetamine -dextroamphetamine  (ADDERALL) 10 MG tablet   Pt would like medication to be sent to  [id number] - [place], [place] - [address] (Ph: [phone])

## 2023-10-28 NOTE — Telephone Encounter (Signed)
 Last seen 08-09-2023 , next appt 03-07-2024, last fill 09-28-2023 #90.

## 2023-10-31 MED ORDER — AMPHETAMINE-DEXTROAMPHETAMINE 10 MG PO TABS: 10.0000 mg | ORAL_TABLET | Freq: Three times a day (TID) | ORAL | 0 refills | Status: DC

## 2023-11-02 MED ORDER — AMPHETAMINE-DEXTROAMPHETAMINE 10 MG PO TABS: 10.0000 mg | ORAL_TABLET | Freq: Three times a day (TID) | ORAL | 0 refills | Status: DC

## 2023-11-02 NOTE — Telephone Encounter (Signed)
 Pt called back and requested Medication be sent to   [facility] at    [address] [place], [place] [address] [facility] [address]  [phone]

## 2023-11-02 NOTE — Telephone Encounter (Signed)
 Pt called stating that the [facility] messaged [facility] informing him that the amphetamine -dextroamphetamine  (ADDERALL) 10 MG tablet Rx can not be filled due to missing information. Please advise.

## 2023-11-02 NOTE — Addendum Note (Signed)
 Addended by: ONEITA NEVELYN BRAVO on: 11/02/2023 11:39 AM   Modules accepted: Orders

## 2023-11-07 ENCOUNTER — Telehealth: Payer: Self-pay | Admitting: Adult Health

## 2023-11-07 NOTE — Telephone Encounter (Signed)
 Patient would like a call back to discuss alterative to BIPAP. Discuss getting Inspire and other alterative not really sure of the name.

## 2023-11-08 NOTE — Telephone Encounter (Signed)
 Phone rep called pt , he accepted the date and time offered by RN

## 2023-11-24 NOTE — Progress Notes (Signed)
 Kyle Carter

## 2023-11-27 NOTE — Progress Notes (Unsigned)
 Provider:  Dedra Gores, MD  Primary Care Physician:  Sabas Norleen PARAS., MD [address] [place] [address]     Referring Provider: Sabas Norleen PARAS., Md [address] [place],  [place] [address]          Chief Complaint according to patient   Patient presents with:          Wearing the machine has been very aggravating for him over the last 1-2 months. He has researched about the weight loss drug helping. He also read on inspire and wanted to know if would be a candidate       HISTORY OF PRESENT ILLNESS:  Kyle Carter is a 58 y.o. male patient who is here for revisit 11/28/2023 for  discussion of INSPIRE .  Wearing the machine has been very aggravating for him over the last 1-2 months. He has researched about the weight loss drug helping. He also read on inspire and wanted to know if would be a candidate     Chief concern according to patient : Doing well on BIPAP over the last 30 days: Wants to discuss alternatives to BiPAP , was referred back to MD by Harlene Bogaert, NP , who had seen him in May.  See attached compliance report and titration report:  Brief history as of 2022 when the patient was in lab evaluated and titrated to BiPAP.  All recording time in supine sleep, he required high pressure settings and finally ended up with a FFM and 22/18 cm water.   He now uses an F 20 AirFit by [employer].  He is having facial hair and air-leaks occur. I suggested to try a memory foam rimmed mask instead of slilca.  He has less nocturia, one time.     DATE OF RECORDING: 01/21/2021  REFERRING M.D.:  Norleen Sabas, MD  Study Performed:   Titration to positive airway pressure  HISTORY:  Kyle Carter , is a right-handed male with know OSA  sleep disorder on BiPAP, who is edentulous and  has a medical  history of ADHD, Generalized Anxiety,  Anger , Depression, Hyperlipidemia,  Hypertension. Sleep apnea was last evaluated by Dr. Mayra in  [place],  09-2017, but his machine dates only from last year, 2021. He reports  Nocturia, 4-5 times a night.  EDS- severe sleepiness> He works for [employer], early shift schedule.   The patient has had issues with snoring and residual apnea on his  current BiPAP set up.  Again, the PAP machine is from 2021 Compliance download data:  over the last 90 days 78% compliance.   He is actually  using at BiPAP with an incoming pressure of 18 / 14 cmH2O with  easy breeze function on. The  residual AHI is 26.6/h  Most of the events still seem to be obstructive in nature .  The air leak at median is  16.2 L .       Review of Systems: Out of a complete 14 system review, the patient complains of only the following symptoms, and all other reviewed systems are negative.:   SLEEPINESS ?  How likely are you to doze in the following situations: 0 = not likely, 1 = slight chance, 2 = moderate chance, 3 = high chance  Sitting and Reading? Watching Television? Sitting inactive in a public place (theater or meeting)? Lying down in the afternoon when circumstances permit? Sitting and talking to someone? Sitting  quietly after lunch without alcohol? In a car, while stopped for a few minutes in traffic? As a passenger in a car for an hour without a break?  Total = 8/ 24   FSS at 19/ 63  GDS 1/ 15   Bone on bone knee pain, had TKR on the right on 09-29-2023. Now less in pain.  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      Social History   Socioeconomic History   Marital status: Married    Spouse name: Not on file   Number of children: Not on file   Years of education: Not on file   Highest education level: Not on file  Occupational History   Not on file  Tobacco Use   Smoking status: Never   Smokeless tobacco: Never  Vaping Use   Vaping status: Never Used  Substance and Sexual Activity   Alcohol use: Not Currently    Comment: 2-3 times per wk, unsure of amount   Drug use: Not Currently    Sexual activity: Yes  Other Topics Concern   Not on file  Social History Narrative   Lives with wife    Works at [employer]    Social Drivers of Corporate investment banker Strain: Not on file  Food Insecurity: Not on file  Transportation Needs: Not on file  Physical Activity: Not on file  Stress: Not on file  Social Connections: Unknown (03/08/2022)   Received from [facility]   Social Network    Social Network: Not on file    Family History  Problem Relation Age of Onset   Diabetes Mother    Liver cancer Mother    Diabetes Father    Sleep apnea Neg Hx    Alzheimer's disease Neg Hx    Dementia Neg Hx     Past Medical History:  Diagnosis Date   ADHD    Anxiety    Depression    Hyperlipidemia    Hypertension    Sleep apnea     Past Surgical History:  Procedure Laterality Date   COLONOSCOPY W/ POLYPECTOMY       Current Outpatient Medications on File Prior to Visit  Medication Sig Dispense Refill   amLODipine  (NORVASC ) 5 MG tablet Take 5 mg by mouth in the morning.     amphetamine -dextroamphetamine  (ADDERALL) 10 MG tablet Take 1 tablet (10 mg total) by mouth in the morning, at noon, and at bedtime. (Patient taking differently: Take 10 mg by mouth 2 (two) times daily with a meal.) 90 tablet 0   aspirin 81 MG chewable tablet Chew 81 mg by mouth daily.     atenolol  (TENORMIN ) 100 MG tablet Take 100 mg by mouth every morning.     atorvastatin  (LIPITOR) 10 MG tablet Take 10 mg by mouth every morning.     busPIRone  (BUSPAR ) 10 MG tablet Take 10 mg by mouth 2 (two) times daily.     celecoxib (CELEBREX) 200 MG capsule Take 200 mg by mouth daily.     escitalopram  (LEXAPRO ) 10 MG tablet Take 1 tablet (10 mg total) by mouth daily. 30 tablet 0   lamoTRIgine (LAMICTAL) 25 MG tablet Take 25 mg by mouth daily.     methocarbamol (ROBAXIN) 500 MG tablet Take 500 mg by mouth every 6 (six) hours as needed.     sildenafil (VIAGRA) 100 MG tablet Take 100 mg by mouth daily as needed for  erectile dysfunction.     zolpidem  (AMBIEN )  10 MG tablet Take 10 mg by mouth at bedtime as needed for sleep.     No current facility-administered medications on file prior to visit.    No Known Allergies   DIAGNOSTIC DATA (LABS, IMAGING, TESTING) - I reviewed patient records, labs, notes, testing and imaging myself where available.  Lab Results  Component Value Date   WBC 7.5 03/22/2023   HGB 15.2 03/22/2023   HCT 43.8 03/22/2023   MCV 85.5 03/22/2023   PLT 242 03/22/2023      Component Value Date/Time   NA 136 03/22/2023 2042   NA 141 09/22/2022 1508   K 3.7 03/22/2023 2042   CL 101 03/22/2023 2042   CO2 25 03/22/2023 2042   GLUCOSE 127 (H) 03/22/2023 2042   BUN 18 03/22/2023 2042   BUN 18 09/22/2022 1508   CREATININE 0.95 03/22/2023 2042   CALCIUM  9.1 03/22/2023 2042   PROT 6.3 09/22/2022 1508   ALBUMIN 4.3 09/22/2022 1508   AST 25 09/22/2022 1508   ALT 25 09/22/2022 1508   ALKPHOS 52 09/22/2022 1508   BILITOT 1.0 09/22/2022 1508   GFRNONAA >60 03/22/2023 2042   GFRAA (L) 04/26/2008 2240    43        The eGFR has been calculated using the MDRD equation. This calculation has not been validated in all clinical situations. eGFR's persistently <60 mL/min signify possible Chronic Kidney Disease.   No results found for: CHOL, HDL, LDLCALC, LDLDIRECT, TRIG, CHOLHDL Lab Results  Component Value Date   HGBA1C 5.6 09/22/2022   Lab Results  Component Value Date   VITAMINB12 289 09/22/2022   Lab Results  Component Value Date   TSH 1.230 09/22/2022    PHYSICAL EXAM:  Vitals:   11/28/23 0838  BP: 132/75  Pulse: 62   No data found. Body mass index is 36.21 kg/m.   Wt Readings from Last 3 Encounters:  11/28/23 256 lb (116.1 kg)  08/09/23 256 lb (116.1 kg)  03/08/23 258 lb 3.2 oz (117.1 kg)     Ht Readings from Last 3 Encounters:  11/28/23 5' 10.5 (1.791 m)  08/09/23 5' 11 (1.803 m)  03/08/23 5' 11 (1.803 m)      General: General:  The patient is awake, alert and appears not in acute distress. The patient is well groomed. Head: Normocephalic, atraumatic. Neck is supple. Mallampati 3,  neck circumference:18 inches . Nasal airflow barely- deviated, left side barely patent.  Retrognathia is not seen.  Dental status: edentulous Cardiovascular:  Regular rate and cardiac rhythm by pulse,  without distended neck veins. Respiratory: Lungs are clear to auscultation.  Skin:  Without evidence of ankle edema, or rash. Trunk: The patient's posture is erect.   Neurologic exam : The patient is awake and alert, oriented to place and time.   Memory subjective described as impaired  Attention span & concentration ability appears impaired-  >MMSE 30/30   HST graduate   Speech is fluent, without dysarthria, dysphonia or aphasia.  Mood and affect are depressed, worried, frustrated.    Cranial nerves: no loss of smell or taste reported  Pupils are equal and briskly reactive to light. Funduscopic exam deferred. .  Extraocular movements in vertical and horizontal planes were intact and without nystagmus. No Diplopia. Visual fields by finger perimetry are intact. Hearing was intact to soft voice and finger rubbing.    Facial sensation intact to fine touch.  Facial motor strength is symmetric and tongue and uvula move midline.  Neck ROM :  rotation, tilt and flexion extension were normal for age and shoulder shrug was symmetrical.    Motor exam:  Symmetric upper extremity bulk, tone and ROM.    ASSESSMENT AND PLAN :   58 y.o. year old male BiPAP user here with:  No candidate for INSPIRE - BMI, apnea class and response to BiPAP.     1) OSA on BiPAP- Frustration about BiPAP use but actually doing very on BiPAP- switch the FFM to a memory foam to help seal.  BMI is high , 36.2. He can benefit from weight loss. FDA approved Mounjaro/ Zepbound.   2) Much reduced EDS - [place] from 15 to 8 points.  Shift worker , currently on medical  leave.   3) no memory concerns.   He will speak to dr S about the desire to use Zepbound.   RV with NP Harlene Bogaert yearly.    I would like to thank Harlene Bogaert, NP,  and Sabas Norleen PARAS., Md [address] [place],  [place] [address] for allowing me to meet with this pleasant patient.   Sleep Clinic Patients are generally offered input on sleep hygiene, life style changes and how to improve compliance with medical treatment where applicable. Review and reiteration of good sleep hygiene measures is offered to any sleep clinic patient, be it in the first consultation or with any follow up visits.    Any patient with sleepiness should be cautioned not to drive, work at heights, or operate dangerous or heavy equipment when feeling tired or sleepy.      The patient will be seen in follow-up in the sleep clinic at [facility] for discussion of test results, sleep related symptoms and treatment compliance review, further management strategies, etc.   The referring provider will be notified of the test results.   The patient's condition requires frequent monitoring and adjustments in the treatment plan, reflecting the ongoing complexity of care.  This provider is the continuing focal point for all needed services for this condition.  After spending a total time of  30  minutes face to face and time for  history taking, physical and neurologic examination, review of laboratory studies,  personal review of imaging studies, reports and results of other testing and review of referral information / records as far as provided in visit,   Electronically signed by: Dedra Gores, MD 11/28/2023 8:55 AM  [name] Board certified by The [employer] of Sleep Medicine and Diplomate of the [employer] of Sleep Medicine. Board certified In Neurology through the ABPN, Fellow of the [employer] of Neurology.

## 2023-11-28 ENCOUNTER — Ambulatory Visit: Admitting: Neurology

## 2023-11-28 ENCOUNTER — Telehealth: Payer: Self-pay | Admitting: Neurology

## 2023-11-28 ENCOUNTER — Encounter: Payer: Self-pay | Admitting: Neurology

## 2023-11-28 VITALS — BP 132/75 | HR 62 | Ht 70.5 in | Wt 256.0 lb

## 2023-11-28 DIAGNOSIS — G4719 Other hypersomnia: Secondary | ICD-10-CM

## 2023-11-28 DIAGNOSIS — G4733 Obstructive sleep apnea (adult) (pediatric): Secondary | ICD-10-CM

## 2023-11-28 DIAGNOSIS — K08109 Complete loss of teeth, unspecified cause, unspecified class: Secondary | ICD-10-CM | POA: Diagnosis not present

## 2023-11-28 DIAGNOSIS — F5112 Insufficient sleep syndrome: Secondary | ICD-10-CM

## 2023-11-28 NOTE — Patient Instructions (Signed)
 Sleep quality improved, daytime sleepiness is lower.  Consider BMI reduction with ZEPBOUND Therapy.  ( AKA Mounjaro)    Tirzepatide Injection (Weight Management) What is this medication? TIRZEPATIDE (tir ZEP a tide) promotes weight loss. It may also be used to maintain weight loss.  It works by decreasing appetite. It can be used to treat sleep apnea. Changes to diet and exercise are often combined with this medication. This medicine may be used for other purposes; ask your health care provider or pharmacist if you have questions. COMMON BRAND NAME(S): Zepbound What should I tell my care team before I take this medication? They need to know if you have any of these conditions: Diabetes Eye disease caused by diabetes Gallbladder disease Have or have had depression Have or have had pancreatitis Having surgery Kidney disease Personal or family history of MEN 2, a condition that causes endocrine gland tumors Personal or family history of thyroid cancer Stomach or intestine problems, such as problems digesting food Suicidal thoughts, plans, or attempt An unusual or allergic reaction to tirzepatide, other medications, foods, dyes, or preservatives Pregnant or trying to get pregnant Breastfeeding How should I use this medication? This medication is injected under the skin. You will be taught how to prepare and give it. Take it as directed on the prescription label. Keep taking it unless your care team tells you to stop. It is important that you put your used needles and syringes in a special sharps container. Do not put them in a trash can. If you do not have a sharps container, call your pharmacist or care team to get one. A special MedGuide will be given to you by the pharmacist with each prescription and refill. Be sure to read this information carefully each time. This medication comes with INSTRUCTIONS FOR USE. Ask your pharmacist for directions on how to use this medication. Read the  information carefully. Talk to your pharmacist or care team if you have questions. Talk to your care team about the use of this medication in children. Special care may be needed. Overdosage: If you think you have taken too much of this medicine contact a poison control center or emergency room at once. NOTE: This medicine is only for you. Do not share this medicine with others. What if I miss a dose? If you miss a dose, take it as soon as you can unless it is more than 4 days (96 hours) late. If it is more than 4 days late, skip the missed dose. Take the next dose at the normal time. Do not take 2 doses within 3 days (72 hours) of each other. What may interact with this medication? Certain medications for diabetes, such as insulin, glyburide, glipizide This medication may affect how other medications work. Talk with your care team about all of the medications you take. They may suggest changes to your treatment plan to lower the risk of side effects and to make sure your medications work as intended. This list may not describe all possible interactions. Give your health care provider a list of all the medicines, herbs, non-prescription drugs, or dietary supplements you use. Also tell them if you smoke, drink alcohol, or use illegal drugs. Some items may interact with your medicine. What should I watch for while using this medication? Visit your care team for regular checks on your progress. Tell your care team if your condition does not start to get better or if it gets worse. Tell your care team if you are  taking medication to treat diabetes, such as insulin or glipizide. This may increase your risk of low blood sugar. Know the symptoms of low blood sugar and how to treat it. Talk to your care team about your risk of cancer. You may be more at risk for certain types of cancer if you take this medication. Talk to your care team right away if you have a lump or swelling in your neck, hoarseness that  does not go away, trouble swallowing, shortness of breath, or trouble breathing. Make sure you stay hydrated while taking this medication. Drink water often. Eat fruits and veggies that have a high water content. Drink more water when it is hot or you are active. Talk to your care team right away if you have fever, infection, vomiting, diarrhea, or if you sweat a lot while taking this medication. The loss of too much body fluid may make it dangerous for you to take this medication. If you are going to need surgery or a procedure, tell your care team that you are taking this medication. Estrogen and progestin hormones that you take by mouth may not work as well while you are taking this medication. Switch to a non-oral contraceptive or add a barrier contraceptive for 4 weeks after starting this medication and after each dose increase. Talk to your care team about contraceptive options. They can help you find the option that works for you. Do not take this medication without first talking to your care team if you may be or could become pregnant. Your care team can help you find the option that works for you. Weight loss is not recommended during pregnancy. Talk to your care team if you are breastfeeding. When recommended, this medication may be taken. Its use during breastfeeding has not been well studied. Your care team may suggest other options. What side effects may I notice from receiving this medication? Side effects that you should report to your care team as soon as possible: Allergic reactions--skin rash, itching, hives, swelling of the face, lips, tongue, or throat Change in vision Dehydration--increased thirst, dry mouth, feeling faint or lightheaded, headache, dark yellow or brown urine Fast or irregular heartbeat Gallbladder problems--severe stomach pain, nausea, vomiting, fever Kidney injury--decrease in the amount of urine, swelling of the ankles, hands, or feet Pancreatitis--severe stomach  pain that spreads to your back or gets worse after eating or when touched, fever, nausea, vomiting Thoughts of suicide or self-harm, worsening mood, feelings of depression Thyroid cancer--new mass or lump in the neck, pain or trouble swallowing, trouble breathing, hoarseness Side effects that usually do not require medical attention (report these to your care team if they continue or are bothersome): Constipation Diarrhea Loss of appetite Nausea Upset stomach This list may not describe all possible side effects. Call your doctor for medical advice about side effects. You may report side effects to FDA at 1-800-FDA-1088. Where should I keep my medication? Keep out of the reach of children and pets. Store in a refrigerator or at room temperature up to 30 degrees C (86 degrees F). Keep it in the original container. Protect from light. Refrigeration (preferred): Store in the refrigerator. Do not freeze. Get rid of any unused medication after the expiration date. Room temperature: This medication may be stored at room temperature for up to 21 days. If it is stored at room temperature, get rid of any unused medication after 21 days or after it expires, whichever is first. To get rid of medications that are  no longer needed or have expired: Take the medication to a medication take-back program. Check with your pharmacy or law enforcement to find a location. If you cannot return the medication, ask your pharmacist or care team how to get rid of this medication safely. NOTE: This sheet is a summary. It may not cover all possible information. If you have questions about this medicine, talk to your doctor, pharmacist, or health care provider.  2025 Elsevier/Gold Standard (2023-04-19 00:00:00)

## 2023-11-28 NOTE — Telephone Encounter (Signed)
 Pt came back in office after appt. Spoke with primary care provider about the zepbound recommended by Dr. Chalice. PCP states he will not prescribe this drug to the pt because he does not see pt for sleep medicine or weight management. Pt wants to know how he can get medication, bc this is something he would like to try. a

## 2023-11-29 NOTE — Telephone Encounter (Signed)
 Referral entered for healthy weight and welllness

## 2023-11-30 ENCOUNTER — Institutional Professional Consult (permissible substitution): Admitting: Family Medicine

## 2023-11-30 ENCOUNTER — Telehealth: Payer: Self-pay | Admitting: Neurology

## 2023-11-30 MED ORDER — AMPHETAMINE-DEXTROAMPHETAMINE 10 MG PO TABS: 10.0000 mg | ORAL_TABLET | Freq: Two times a day (BID) | ORAL | 0 refills | Status: DC

## 2023-12-06 ENCOUNTER — Other Ambulatory Visit: Payer: Self-pay | Admitting: Neurology

## 2023-12-07 ENCOUNTER — Ambulatory Visit: Admitting: Family Medicine

## 2023-12-07 ENCOUNTER — Encounter: Payer: Self-pay | Admitting: Family Medicine

## 2023-12-07 ENCOUNTER — Other Ambulatory Visit: Payer: Self-pay | Admitting: Neurology

## 2023-12-07 VITALS — BP 112/69 | HR 64 | Ht 70.5 in | Wt 256.0 lb

## 2023-12-07 DIAGNOSIS — M170 Bilateral primary osteoarthritis of knee: Secondary | ICD-10-CM

## 2023-12-07 DIAGNOSIS — G4733 Obstructive sleep apnea (adult) (pediatric): Secondary | ICD-10-CM

## 2023-12-07 DIAGNOSIS — F332 Major depressive disorder, recurrent severe without psychotic features: Secondary | ICD-10-CM

## 2023-12-07 DIAGNOSIS — E66812 Obesity, class 2: Secondary | ICD-10-CM

## 2023-12-07 DIAGNOSIS — Z6836 Body mass index (BMI) 36.0-36.9, adult: Secondary | ICD-10-CM

## 2023-12-07 MED ORDER — AMPHETAMINE-DEXTROAMPHETAMINE 10 MG PO TABS: 10.0000 mg | ORAL_TABLET | Freq: Three times a day (TID) | ORAL | 0 refills | Status: DC

## 2023-12-07 NOTE — Progress Notes (Signed)
 Changed prescription to adderall tid po. 10 mg  tab, IR.

## 2023-12-07 NOTE — Progress Notes (Signed)
 Office: [phone]  /  Fax: [phone]   Initial Visit  Kyle Carter was seen in clinic today to evaluate for obesity. He is interested in losing weight to improve overall health and reduce the risk of weight related complications. He presents today to review program treatment options, initial physical assessment, and evaluation.     He was referred by: Specialist  When asked what else they would like to accomplish? He states: Adopt a healthier eating pattern and lifestyle, Improve energy levels and physical activity, Improve existing medical conditions, Reduce number of medications, and Improve quality of life.  Felt better at 225 lb.  Eating habits have worsened.  Works at [employer].  Weight history:  weight is slowly rising (lately has been stable).  At max weight now.  Recently had TKR.  Has had stressors at home.  When asked how has your weight affected you? He states: Relationships, Contributed to medical problems, and Having fatigue  Some associated conditions: Arthritis:knees and OSA  Contributing factors: family history of obesity, consumption of processed foods, moderate to high levels of stress, reduced physical activity, chronic skipping of meals, and mental health problems  Weight promoting medications identified: None  Current nutrition plan: None  Current level of physical activity: NEAT  in PT for knee  Current or previous pharmacotherapy: None  Response to medication: Never tried medications   Past medical history includes:   Past Medical History:  Diagnosis Date   ADHD    Anxiety    Depression    Hyperlipidemia    Hypertension    Sleep apnea      Objective:   BP 112/69   Pulse 64   Ht 5' 10.5 (1.791 m)   Wt 256 lb (116.1 kg)   SpO2 98%   BMI 36.21 kg/m  He was weighed on the bioimpedance scale: Body mass index is 36.21 kg/m.  Peak Weight:256 , Body Fat%:38.7, Visceral Fat Rating:23, Weight trend over the last 12 months: Increasing  General:   Alert, oriented and cooperative. Patient is in no acute distress.  Respiratory: Normal respiratory effort, no problems with respiration noted   Gait: able to ambulate independently  Mental Status: Normal mood and affect. Normal behavior. Normal judgment and thought content.   DIAGNOSTIC DATA REVIEWED:  BMET    Component Value Date/Time   NA 136 03/22/2023 2042   NA 141 09/22/2022 1508   K 3.7 03/22/2023 2042   CL 101 03/22/2023 2042   CO2 25 03/22/2023 2042   GLUCOSE 127 (H) 03/22/2023 2042   BUN 18 03/22/2023 2042   BUN 18 09/22/2022 1508   CREATININE 0.95 03/22/2023 2042   CALCIUM  9.1 03/22/2023 2042   GFRNONAA >60 03/22/2023 2042   GFRAA (L) 04/26/2008 2240    43        The eGFR has been calculated using the MDRD equation. This calculation has not been validated in all clinical situations. eGFR's persistently <60 mL/min signify possible Chronic Kidney Disease.   Lab Results  Component Value Date   HGBA1C 5.6 09/22/2022   No results found for: INSULIN CBC    Component Value Date/Time   WBC 7.5 03/22/2023 2042   RBC 5.12 03/22/2023 2042   HGB 15.2 03/22/2023 2042   HGB 13.4 09/22/2022 1508   HCT 43.8 03/22/2023 2042   HCT 40.1 09/22/2022 1508   PLT 242 03/22/2023 2042   PLT 233 09/22/2022 1508   MCV 85.5 03/22/2023 2042   MCV 86 09/22/2022 1508   MCH  29.7 03/22/2023 2042   MCHC 34.7 03/22/2023 2042   RDW 13.7 03/22/2023 2042   RDW 13.7 09/22/2022 1508   Iron/TIBC/Ferritin/ %Sat No results found for: IRON, TIBC, FERRITIN, IRONPCTSAT Lipid Panel  No results found for: CHOL, TRIG, HDL, CHOLHDL, VLDL, LDLCALC, LDLDIRECT Hepatic Function Panel     Component Value Date/Time   PROT 6.3 09/22/2022 1508   ALBUMIN 4.3 09/22/2022 1508   AST 25 09/22/2022 1508   ALT 25 09/22/2022 1508   ALKPHOS 52 09/22/2022 1508   BILITOT 1.0 09/22/2022 1508      Component Value Date/Time   TSH 1.230 09/22/2022 1508     Assessment and Plan:    Primary osteoarthritis of both knees Exercise has reduced due to bilateral knee pain.  Obesity has contributed to his DJD knees with an active job at [employer].  He did well s/p R TKR currently in PT and plans to have L TKR next June.  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, baseball, softball and volleyball to stay active   Class 2 severe obesity due to excess calories with serious comorbidity and body mass index (BMI) of 36.0 to 36.9 in adult (HCC)  OSA treated with BiPAP Continue BipPap with Dr Chalice, working on improving sleep time  Begin active plan for weight reduction Consider use of Zepbound for OSA indication  Severe episode of recurrent major depressive disorder, without psychotic features (HCC) In review of his chart, he is on Lamictal 25 mg daily and Lexapro  10 mg daily per Dr Lynnette Stressors at home w/ his wife have improved Emotional eating did contribute to weight gain over the past 2 years.     Obesity Treatment / Action Plan:  Patient will work on garnering support from family and friends to begin weight loss journey. Will work on eliminating or reducing the presence of highly palatable, calorie dense foods in the home. Will complete provided nutritional and psychosocial assessment questionnaire before the next appointment. Will be scheduled for indirect calorimetry to determine resting energy expenditure in a fasting state.  This will allow us  to create a reduced calorie, high-protein meal plan to promote loss of fat mass while preserving muscle mass. Will think about ideas on how to incorporate physical activity into their daily routine.  Obesity Education Performed Today:  He was weighed on the bioimpedance scale and results were discussed and documented in the synopsis.  We discussed obesity as a disease and the importance of a more detailed evaluation of all the factors contributing to the disease.  We discussed the importance of long term lifestyle changes  which include nutrition, exercise and behavioral modifications as well as the importance of customizing this to his specific health and social needs.  We discussed the benefits of reaching a healthier weight to alleviate the symptoms of existing conditions and reduce the risks of the biomechanical, metabolic and psychological effects of obesity.  Kyle NOVAK Geary appears to be in the action stage of change and states they are ready to start intensive lifestyle modifications and behavioral modifications.  22 minutes was spent today on this visit including the above counseling, pre-visit chart review, and post-visit documentation.  Reviewed by clinician on day of visit: allergies, medications, problem list, medical history, surgical history, family history, social history, and previous encounter notes pertinent to obesity diagnosis.    Darice Haddock, D.O. DABFM, [facility] [address] [place], [place] [address] [phone]

## 2023-12-07 NOTE — Telephone Encounter (Signed)
 She has sent it over to the pharmacy today. Looks like it went through. [place]

## 2023-12-16 NOTE — Telephone Encounter (Signed)
 Started in error. Med has already been sent for the pt

## 2024-01-02 ENCOUNTER — Ambulatory Visit: Admitting: Family Medicine

## 2024-01-06 ENCOUNTER — Encounter: Payer: Self-pay | Admitting: Adult Health

## 2024-01-06 NOTE — Telephone Encounter (Signed)
 Requested Prescriptions   Pending Prescriptions Disp Refills   amphetamine -dextroamphetamine  (ADDERALL) 10 MG tablet 90 tablet 0    Sig: Take 1 tablet (10 mg total) by mouth in the morning, at noon, and at bedtime.   Last seen 11/28/23 Next appt 11/27/24  Dispenses   Dispensed Days Supply Quantity Provider Pharmacy  D-AMPHETAMINE  SALT COMBO 10MG  TAB 12/08/2023 30 90 each Dohmeier, Dedra, MD [facility]...  D-AMPHETAMINE  SALT COMBO 10MG  TAB 11/02/2023 30 90 each Whitfield Raisin, NP [facility] #...  DEXTROAMP-AMPHETAMIN 10 MG TAB 09/28/2023 30 90 each Whitfield Raisin, NP [facility] [phone] - R...  DEXTROAMP-AMPHETAMIN 10 MG TAB 08/09/2023 30 90 each Whitfield Raisin, NP [facility] [phone] - R...  DEXTROAMP-AMPHETAMIN 10 MG TAB 07/05/2023 30 90 each Whitfield Raisin, NP [facility] [phone] - R...  DEXTROAMP-AMPHETAMIN 10 MG TAB 05/30/2023 30 90 each Whitfield Raisin, NP [facility] [phone] - R...  DEXTROAMP-AMPHETAMINE  5 MG TAB 04/28/2023 30 180 each Dohmeier, Dedra, MD [facility] [phone] - R...  DEXTROAMP-AMPHETAMIN 10 MG TAB 04/12/2023 30 90 each Whitfield Raisin, NP [facility] [phone] - R...  DEXTROAMP-AMPHETAMIN 10 MG TAB 03/10/2023 30 90 each Whitfield Raisin, NP [facility] [phone] - R...  DEXTROAMP-AMPHETAMIN 10 MG TAB 02/09/2023 30 90 each Dohmeier, Dedra, MD [facility] [phone] - R.SABRASABRA

## 2024-01-10 MED ORDER — AMPHETAMINE-DEXTROAMPHETAMINE 10 MG PO TABS: 10.0000 mg | ORAL_TABLET | Freq: Three times a day (TID) | ORAL | 0 refills | Status: DC

## 2024-01-11 ENCOUNTER — Other Ambulatory Visit: Payer: Self-pay | Admitting: Medical Genetics

## 2024-01-18 ENCOUNTER — Ambulatory Visit: Admitting: Family Medicine

## 2024-01-31 ENCOUNTER — Ambulatory Visit: Admitting: Family Medicine

## 2024-02-08 ENCOUNTER — Encounter: Payer: Self-pay | Admitting: Adult Health

## 2024-02-09 MED ORDER — AMPHETAMINE-DEXTROAMPHETAMINE 10 MG PO TABS
10.0000 mg | ORAL_TABLET | Freq: Three times a day (TID) | ORAL | 0 refills | Status: AC
Start: 2024-02-09 — End: ?

## 2024-02-09 NOTE — Telephone Encounter (Signed)
 Last visit: 11/28/23  Next visit: 11/27/24  Last fills:   Dr Chalice did not address this in her last note. Previous visit with Harlene NP stated to continue medication. Both providers are out of the office today so I have sent this request to the work-in physician to review.

## 2024-02-14 ENCOUNTER — Ambulatory Visit: Admitting: Family Medicine

## 2024-03-07 ENCOUNTER — Ambulatory Visit: Payer: BC Managed Care – PPO | Admitting: Adult Health

## 2024-03-15 ENCOUNTER — Other Ambulatory Visit: Payer: Self-pay

## 2024-03-15 DIAGNOSIS — E785 Hyperlipidemia, unspecified: Secondary | ICD-10-CM | POA: Insufficient documentation

## 2024-03-15 DIAGNOSIS — F909 Attention-deficit hyperactivity disorder, unspecified type: Secondary | ICD-10-CM | POA: Insufficient documentation

## 2024-03-15 DIAGNOSIS — F32A Depression, unspecified: Secondary | ICD-10-CM | POA: Insufficient documentation

## 2024-03-15 DIAGNOSIS — F419 Anxiety disorder, unspecified: Secondary | ICD-10-CM | POA: Insufficient documentation

## 2024-03-16 ENCOUNTER — Encounter: Payer: Self-pay | Admitting: Cardiology

## 2024-03-16 ENCOUNTER — Ambulatory Visit: Admitting: Cardiology

## 2024-03-16 ENCOUNTER — Telehealth: Payer: Self-pay | Admitting: Cardiology

## 2024-03-16 ENCOUNTER — Ambulatory Visit: Attending: Cardiology | Admitting: Cardiology

## 2024-03-16 VITALS — BP 130/78 | HR 82 | Ht 70.6 in | Wt 258.6 lb

## 2024-03-16 DIAGNOSIS — R0609 Other forms of dyspnea: Secondary | ICD-10-CM | POA: Diagnosis not present

## 2024-03-16 DIAGNOSIS — I4891 Unspecified atrial fibrillation: Secondary | ICD-10-CM | POA: Insufficient documentation

## 2024-03-16 DIAGNOSIS — E782 Mixed hyperlipidemia: Secondary | ICD-10-CM | POA: Diagnosis not present

## 2024-03-16 DIAGNOSIS — I10 Essential (primary) hypertension: Secondary | ICD-10-CM | POA: Insufficient documentation

## 2024-03-16 DIAGNOSIS — R011 Cardiac murmur, unspecified: Secondary | ICD-10-CM | POA: Diagnosis not present

## 2024-03-16 DIAGNOSIS — G4733 Obstructive sleep apnea (adult) (pediatric): Secondary | ICD-10-CM | POA: Insufficient documentation

## 2024-03-16 HISTORY — DX: Other forms of dyspnea: R06.09

## 2024-03-16 MED ORDER — METOPROLOL TARTRATE 100 MG PO TABS: ORAL_TABLET | ORAL | 0 refills | Status: AC

## 2024-03-16 MED ORDER — APIXABAN 5 MG PO TABS: 5.0000 mg | ORAL_TABLET | Freq: Two times a day (BID) | ORAL | 3 refills | Status: AC

## 2024-03-16 NOTE — Telephone Encounter (Signed)
 Patient states they he really needs to keep taking his adderall because it helps him stay awake while driving. CB # [phone]

## 2024-03-16 NOTE — Progress Notes (Signed)
 Cardiology Office Note:    Date:  03/16/2024   ID:  Kyle Carter, DOB 12/27/1965, MRN [id number]  PCP:  Sabas Norleen PARAS., MD  Cardiologist:  Jennifer JONELLE Crape, MD   Referring MD: Sabas Norleen PARAS., MD    ASSESSMENT:    1. Essential (primary) hypertension   2. Atrial fibrillation, unspecified type (HCC)   3. OSA (obstructive sleep apnea)   4. OSA treated with BiPAP   5. Mixed hyperlipidemia   6. DOE (dyspnea on exertion)    PLAN:    In order of problems listed above:  Primary prevention stressed with the patient.  Importance of compliance with diet medication stressed and patient verbalized standing. Cardiac murmur: Echocardiogram will be done to assess murmur heard on auscultation. Newly diagnosed atrial fibrillation:I discussed with the patient atrial fibrillation, disease process. Management and therapy including rate and rhythm control, anticoagulation benefits and potential risks were discussed extensively with the patient. Patient had multiple questions which were answered to patient's satisfaction. I have initiated him on Eliquis 5 mg twice daily.  Benefits and potential risks explained and he vocalized understanding.  He will do I follow-up in 2 weeks. Dyspnea on exertion: We have multiple risk factors we will do a CT coronary angiography with FFR to rule out any ischemic substrate.  This will also help antiarrhythmic therapy in the future. Essential hypertension: Blood pressure is stable and diet was emphasized.  Lifestyle modification urged. Mixed dyslipidemia: On lipid-lowering medications followed by primary care. Sleep apnea: Follows sleep health specialist on a regular basis and gets 6 equipment and sleep apnea titration study. Patient will be seen in follow-up appointment in 2 months or earlier if the patient has any concerns.    Medication Adjustments/Labs and Tests Ordered: Current medicines are reviewed at length with the patient today.  Concerns regarding  medicines are outlined above.  Orders Placed This Encounter  Procedures   EKG 12-Lead   No orders of the defined types were placed in this encounter.    History of Present Illness:    Kyle Carter is a 58 y.o. male who is being seen today for the evaluation of newly diagnosed atrial fibrillation at the request of Sl[place]ky, Norleen PARAS., MD. patient is a pleasant 58 year old male.  He has past medical history of essential hypertension, mixed dyslipidemia and mentions to me that he is a diabetic.  He also mentions to me that he was wearing his Apple Watch which revealed atrial fibrillation.  This was confirmed at the clinic and therefore he was sent for evaluation.  He has some shortness of breath on exertion.  No chest pain orthopnea or PND.  At the time of my evaluation, the patient is alert awake oriented and in no distress.  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/20/2020   Episode of altered consciousness 09/22/2022   Essential (primary) hypertension    Excessive daytime sleepiness 11/20/2020   GAD (generalized anxiety disorder) 10/13/2021   Hyperlipidemia    Hypertension    Insufficient sleep syndrome 11/20/2020   MDD (major depressive disorder), recurrent episode, severe (HCC) 10/13/2021   Nocturia more than twice per night 11/20/2020   OSA (obstructive sleep apnea)    OSA treated with BiPAP 11/20/2020   Outbursts of anger 08/11/2022  Suicidal ideation 10/13/2021    Past Surgical History:  Procedure Laterality Date   COLONOSCOPY W/ POLYPECTOMY      Current Medications: Active Medications[1]   Allergies:   Patient has no known allergies.   Social History   Socioeconomic History   Marital status: Married    Spouse name: Not on file   Number of children: Not on file   Years  of education: Not on file   Highest education level: Not on file  Occupational History   Not on file  Tobacco Use   Smoking status: Never   Smokeless tobacco: Never  Vaping Use   Vaping status: Never Used  Substance and Sexual Activity   Alcohol use: Not Currently    Comment: 2-3 times per wk, unsure of amount   Drug use: Not Currently   Sexual activity: Yes  Other Topics Concern   Not on file  Social History Narrative   Lives with wife    Works at [employer]    Social Drivers of Health   Tobacco Use: Low Risk (03/16/2024)   Patient History    Smoking Tobacco Use: Never    Smokeless Tobacco Use: Never    Passive Exposure: Not on file  Financial Resource Strain: Not on file  Food Insecurity: Not on file  Transportation Needs: Not on file  Physical Activity: Not on file  Stress: Not on file  Social Connections: Unknown (03/08/2022)   [facility]   Social Network    Social Network: Not on file  Depression (PHQ2-9): Medium Risk (10/12/2021)   Depression (PHQ2-9)    PHQ-2 Score: 7  Alcohol Screen: Low Risk (10/13/2021)   Alcohol Screen    Last Alcohol Screening Score (AUDIT): 4  Housing: Not on file  Utilities: Not on file  Health Literacy: Not on file     Family History: The patient's family history includes Diabetes in his father and mother; Liver cancer in his mother. There is no history of Sleep apnea, Alzheimer's disease, or Dementia.  ROS:   Please see the history of present illness.    All other systems reviewed and are negative.  EKGs/Labs/Other Studies Reviewed:    The following studies were reviewed today:  EKG Interpretation Date/Time:  Friday March 16 2024 10:00:05 EST Ventricular Rate:  82 PR Interval:    QRS Duration:  92 QT Interval:  364 QTC Calculation: 425 R Axis:   11  Text Interpretation: Atrial fibrillation Minimal voltage criteria for LVH, may be normal variant Cannot rule out Anterior infarct , age undetermined Abnormal ECG  When compared with ECG of 23-Mar-2023 00:52, PREVIOUS ECG IS PRESENT Confirmed by Edwyna Backers [phone]) on 03/16/2024 10:12:05 AM     Recent Labs: 03/22/2023: BUN 18; Creatinine, Ser 0.95; Hemoglobin 15.2; Platelets 242; Potassium 3.7; Sodium 136  Recent Lipid Panel No results found for: CHOL, TRIG, HDL, CHOLHDL, VLDL, LDLCALC, LDLDIRECT  Physical Exam:    VS:  BP 130/78   Pulse 82   Ht 5' 10.6 (1.793 m)   Wt 258 lb 9.6 oz (117.3 kg)   SpO2 97%   BMI 36.48 kg/m     Wt Readings from Last 3 Encounters:  03/16/24 258 lb 9.6 oz (117.3 kg)  12/07/23 256 lb (116.1 kg)  11/28/23 256 lb (116.1 kg)     GEN: Patient is in no acute distress HEENT: Normal NECK: No JVD; No carotid bruits LYMPHATICS: No lymphadenopathy CARDIAC: S1 S2 regular, 2/6 systolic murmur at the apex. RESPIRATORY:  Clear to auscultation without rales, wheezing or rhonchi  ABDOMEN: Soft, non-tender, non-distended MUSCULOSKELETAL:  No edema; No deformity  SKIN: Warm and dry NEUROLOGIC:  Alert and oriented x 3 PSYCHIATRIC:  Normal affect    Signed, Jennifer JONELLE Crape, MD  03/16/2024 10:19 AM    [facility]      [1]  Current Meds  Medication Sig   amLODipine  (NORVASC ) 5 MG tablet Take 5 mg by mouth in the morning.   amphetamine -dextroamphetamine  (ADDERALL) 30 MG tablet Take 30 mg by mouth 2 (two) times daily.   atenolol  (TENORMIN ) 100 MG tablet Take 100 mg by mouth every morning.   atorvastatin  (LIPITOR) 10 MG tablet Take 10 mg by mouth every morning.   busPIRone  (BUSPAR ) 10 MG tablet Take 10 mg by mouth 2 (two) times daily.   celecoxib (CELEBREX) 200 MG capsule Take 200 mg by mouth daily.   escitalopram  (LEXAPRO ) 10 MG tablet Take 1 tablet (10 mg total) by mouth daily.   glipiZIDE (GLUCOTROL XL) 5 MG 24 hr tablet Take 5 mg by mouth daily.   hydrochlorothiazide (HYDRODIURIL) 25 MG tablet Take 25 mg by mouth daily.   lisinopril (ZESTRIL) 10 MG tablet Take 10 mg by  mouth daily.   methocarbamol (ROBAXIN) 500 MG tablet Take 500 mg by mouth every 6 (six) hours as needed.   sildenafil (VIAGRA) 100 MG tablet Take 100 mg by mouth daily as needed for erectile dysfunction.   zolpidem  (AMBIEN ) 10 MG tablet Take 10 mg by mouth at bedtime as needed for sleep.

## 2024-03-16 NOTE — Patient Instructions (Signed)
 Medication Instructions:  Your physician has recommended you make the following change in your medication:  Start taking Eliquis 5 mg twice daily    Please refer to the Current Medication list given to you today.  *If you need a refill on your cardiac medications before your next appointment, please call your pharmacy*   Lab Work: None ordered If you have labs (blood work) drawn today and your tests are completely normal, you will receive your results only by: MyChart Message (if you have MyChart) OR A paper copy in the mail If you have any lab test that is abnormal or we need to change your treatment, we will call you to review the results.  Testing/Procedures: Your physician has requested that you have an echocardiogram. Echocardiography is a painless test that uses sound waves to create images of your heart. It provides your doctor with information about the size and shape of your heart and how well your hearts chambers and valves are working. This procedure takes approximately one hour. There are no restrictions for this procedure. Please do NOT wear cologne, perfume, aftershave, or lotions (deodorant is allowed). Please arrive 15 minutes prior to your appointment time.  Please note: We ask at that you not bring children with you during ultrasound (echo/ vascular) testing. Due to room size and safety concerns, children are not allowed in the ultrasound rooms during exams. Our front office staff cannot provide observation of children in our lobby area while testing is being conducted. An adult accompanying a patient to their appointment will only be allowed in the ultrasound room at the discretion of the ultrasound technician under special circumstances. We apologize for any inconvenience.    Your cardiac CT will be scheduled at one of the below locations:   [facility] [address] [place], [place] [phone]  Please follow these instructions carefully (unless otherwise  directed):  An IV will be required for this test and Nitroglycerin will be given.  Hold all erectile dysfunction medications at least 3 days (72 hrs) prior to test. (Ie viagra, cialis, sildenafil, tadalafil, etc)   On the Night Before the Test: Be sure to Drink plenty of water. Do not consume any caffeinated/decaffeinated beverages or chocolate 12 hours prior to your test. Do not take any antihistamines 12 hours prior to your test.   On the Day of the Test: Drink plenty of water until 1 hour prior to the test. Do not eat any food 1 hour prior to test. You may take your regular medications prior to the test.  Take metoprolol (Lopressor) 100 mg two hours prior to test. Hold your Hydrochlorothiazide the morning of the test.       After the Test: Drink plenty of water. After receiving IV contrast, you may experience a mild flushed feeling. This is normal. On occasion, you may experience a mild rash up to 24 hours after the test. This is not dangerous. If this occurs, you can take Benadryl 25 mg, Zyrtec, Claritin, or Allegra and increase your fluid intake. (Patients taking Tikosyn should avoid Benadryl, and may take Zyrtec, Claritin, or Allegra) If you experience trouble breathing, this can be serious. If it is severe call 911 IMMEDIATELY. If it is mild, please call our office.  We will call to schedule your test 2-4 weeks out understanding that some insurance companies will need an authorization prior to the service being performed.   For more information and frequently asked questions, please visit our website : [email or web address]  For  non-scheduling related questions, please contact the cardiac imaging nurse navigator should you have any questions/concerns: Cardiac Imaging Nurse Navigators Direct Office Dial: [phone]   For scheduling needs, including cancellations and rescheduling, please call Brittany, [phone].   Follow-Up: At [facility], you and your  health needs are our priority.  As part of our continuing mission to provide you with exceptional heart care, we have created designated Provider Care Teams.  These Care Teams include your primary Cardiologist (physician) and Advanced Practice Providers (APPs -  Physician Assistants and Nurse Practitioners) who all work together to provide you with the care you need, when you need it.  We recommend signing up for the patient portal called MyChart.  Sign up information is provided on this After Visit Summary.  MyChart is used to connect with patients for Virtual Visits (Telemedicine).  Patients are able to view lab/test results, encounter notes, upcoming appointments, etc.  Non-urgent messages can be sent to your provider as well.   To learn more about what you can do with MyChart, go to [email or web address].    Your next appointment:   2 month(s)  The format for your next appointment:   In Person  Provider:   Jennifer Crape, MD   Other Instructions Echocardiogram An echocardiogram is a test that uses sound waves (ultrasound) to produce images of the heart. Images from an echocardiogram can provide important information about: Heart size and shape. The size and thickness and movement of your heart's walls. Heart muscle function and strength. Heart valve function or if you have stenosis. Stenosis is when the heart valves are too narrow. If blood is flowing backward through the heart valves (regurgitation). A tumor or infectious growth around the heart valves. Areas of heart muscle that are not working well because of poor blood flow or injury from a heart attack. Aneurysm detection. An aneurysm is a weak or damaged part of an artery wall. The wall bulges out from the normal force of blood pumping through the body. Tell a health care provider about: Any allergies you have. All medicines you are taking, including vitamins, herbs, eye drops, creams, and over-the-counter medicines. Any  blood disorders you have. Any surgeries you have had. Any medical conditions you have. Whether you are pregnant or may be pregnant. What are the risks? Generally, this is a safe test. However, problems may occur, including an allergic reaction to dye (contrast) that may be used during the test. What happens before the test? No specific preparation is needed. You may eat and drink normally. What happens during the test? You will take off your clothes from the waist up and put on a hospital gown. Electrodes or electrocardiogram (ECG)patches may be placed on your chest. The electrodes or patches are then connected to a device that monitors your heart rate and rhythm. You will lie down on a table for an ultrasound exam. A gel will be applied to your chest to help sound waves pass through your skin. A handheld device, called a transducer, will be pressed against your chest and moved over your heart. The transducer produces sound waves that travel to your heart and bounce back (or echo back) to the transducer. These sound waves will be captured in real-time and changed into images of your heart that can be viewed on a video monitor. The images will be recorded on a computer and reviewed by your health care provider. You may be asked to change positions or hold your breath for a short time. This  makes it easier to get different views or better views of your heart. In some cases, you may receive contrast through an IV in one of your veins. This can improve the quality of the pictures from your heart. The procedure may vary among health care providers and hospitals.   What can I expect after the test? You may return to your normal, everyday life, including diet, activities, and medicines, unless your health care provider tells you not to do that. Follow these instructions at home: It is up to you to get the results of your test. Ask your health care provider, or the department that is doing the test,  when your results will be ready. Keep all follow-up visits. This is important. Summary An echocardiogram is a test that uses sound waves (ultrasound) to produce images of the heart. Images from an echocardiogram can provide important information about the size and shape of your heart, heart muscle function, heart valve function, and other possible heart problems. You do not need to do anything to prepare before this test. You may eat and drink normally. After the echocardiogram is completed, you may return to your normal, everyday life, unless your health care provider tells you not to do that. This information is not intended to replace advice given to you by your health care provider. Make sure you discuss any questions you have with your health care provider. Document Revised: 11/13/2019 Document Reviewed: 11/13/2019 Elsevier Patient Education  2021 Elsevier Inc.   Important Information About Sugar

## 2024-03-16 NOTE — Telephone Encounter (Signed)
 Per Patient please call number below to get auth for  Procedure: CT CORONARY MORPH W/CTA COR W/SCORE W/CA W/CM &/OR WO/CM    UFIN [id number]

## 2024-03-18 ENCOUNTER — Encounter: Payer: Self-pay | Admitting: Adult Health

## 2024-03-19 NOTE — Telephone Encounter (Signed)
 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 Advised to call his provider and discuss alternative medication with them as he has a new dx of afib. Pt verbalized understanding and had no additional questions.

## 2024-03-19 NOTE — Telephone Encounter (Signed)
 Patient is calling about and asking that someone give him a call back this morning! Patient states that it is urgent. Please advise

## 2024-03-20 NOTE — Telephone Encounter (Signed)
 Called [facility] [phone] and provided information requested. Faxed copy of order to [phone] as requested. Confirmation that fax was received.

## 2024-03-20 NOTE — Telephone Encounter (Signed)
 Pt last seen 11/28/23, has upcoming 11/2024. Last filled 02/09/24. Refill is appropriate.

## 2024-03-21 ENCOUNTER — Ambulatory Visit: Payer: Self-pay | Admitting: Cardiology

## 2024-03-21 DIAGNOSIS — E782 Mixed hyperlipidemia: Secondary | ICD-10-CM

## 2024-03-21 DIAGNOSIS — I2510 Atherosclerotic heart disease of native coronary artery without angina pectoris: Secondary | ICD-10-CM

## 2024-03-21 LAB — FECAL OCCULT BLOOD, IMMUNOCHEMICAL: Fecal Occult Bld: NEGATIVE

## 2024-03-21 MED ORDER — AMPHETAMINE-DEXTROAMPHETAMINE 30 MG PO TABS: 30.0000 mg | ORAL_TABLET | Freq: Two times a day (BID) | ORAL | 0 refills | Status: DC

## 2024-03-21 NOTE — Telephone Encounter (Signed)
 Pt called to follow up about medication refill  amphetamine -dextroamphetamine  (ADDERALL) 30 MG tablet   Pt  Pharmacy  informed to follow up with MD Office

## 2024-03-21 NOTE — Telephone Encounter (Signed)
 [facility] calling to requesting the order to se resent and have the pts demographics attached. Fax: [phone]

## 2024-03-21 NOTE — Telephone Encounter (Signed)
 Provider signed Rx this morning Receipt confirmed by pharmacy (03/21/2024  8:46 AM EST)

## 2024-03-22 ENCOUNTER — Telehealth: Payer: Self-pay | Admitting: Cardiology

## 2024-03-22 NOTE — Telephone Encounter (Signed)
 Refaxed order with demographics to [facility]

## 2024-03-22 NOTE — Telephone Encounter (Signed)
 Ty with [facility] calling in regard to a CT Coronary Score Morph order sent to them. The patients demographics are missing. They are asking the order be updated and resent by fax to them at [phone]

## 2024-03-22 NOTE — Telephone Encounter (Signed)
 Order with demographics re faxed and confirmation received.

## 2024-03-23 NOTE — Telephone Encounter (Signed)
 Caller (Ty) stated the information received was lost in their system and is requesting patient 's order with demographic information be re-faxed to them at fax# [phone].  Caller provided patient reference# [id number].

## 2024-03-26 NOTE — Telephone Encounter (Signed)
 Patient states that he cannot get his CT scheduled because we are sending in the order with no demographics. He states that they need his name, address, phone number and DOB. I did advise him that the nurse refaxed again today but not sure if this information was included. Please advise.

## 2024-03-26 NOTE — Telephone Encounter (Signed)
Refaxed and confirmation received

## 2024-03-26 NOTE — Telephone Encounter (Signed)
 Spoke with pt and advised that order with demographics had been faxed 2 times and refaxed again today.

## 2024-03-30 ENCOUNTER — Encounter (HOSPITAL_COMMUNITY): Payer: Self-pay

## 2024-04-03 ENCOUNTER — Ambulatory Visit (HOSPITAL_BASED_OUTPATIENT_CLINIC_OR_DEPARTMENT_OTHER)
Admission: RE | Admit: 2024-04-03 | Discharge: 2024-04-03 | Disposition: A | Discharge status: Home / Self Care | Source: Ambulatory Visit | Attending: Cardiology | Admitting: Cardiology

## 2024-04-03 DIAGNOSIS — R0609 Other forms of dyspnea: Secondary | ICD-10-CM

## 2024-04-03 MED ORDER — METOPROLOL TARTRATE 5 MG/5ML IV SOLN
10.0000 mg | Freq: Once | INTRAVENOUS | Status: AC | PRN
  Administered 2024-04-03: 5 mg via INTRAVENOUS

## 2024-04-03 MED ORDER — NITROGLYCERIN 0.4 MG SL SUBL
0.8000 mg | SUBLINGUAL_TABLET | Freq: Once | SUBLINGUAL | Status: AC
  Administered 2024-04-03: 0.8 mg via SUBLINGUAL

## 2024-04-03 MED ORDER — IOHEXOL 350 MG/ML SOLN
95.0000 mL | Freq: Once | INTRAVENOUS | Status: AC | PRN
  Administered 2024-04-03: 95 mL via INTRAVENOUS

## 2024-04-05 ENCOUNTER — Other Ambulatory Visit: Payer: Self-pay | Admitting: Cardiology

## 2024-04-05 ENCOUNTER — Inpatient Hospital Stay (INDEPENDENT_AMBULATORY_CARE_PROVIDER_SITE_OTHER)
Admission: RE | Admit: 2024-04-05 | Discharge: 2024-04-05 | Disposition: A | Discharge status: Home / Self Care | Payer: Self-pay | Source: Ambulatory Visit | Attending: Cardiology | Admitting: Cardiology

## 2024-04-05 DIAGNOSIS — R931 Abnormal findings on diagnostic imaging of heart and coronary circulation: Secondary | ICD-10-CM

## 2024-04-05 NOTE — Progress Notes (Signed)
 FFR Order

## 2024-04-06 MED ORDER — NITROGLYCERIN 0.4 MG SL SUBL: 0.4000 mg | SUBLINGUAL_TABLET | SUBLINGUAL | 6 refills | Status: AC | PRN

## 2024-04-06 MED ORDER — ASPIRIN 81 MG PO TBEC: 81.0000 mg | DELAYED_RELEASE_TABLET | Freq: Every day | ORAL | Status: AC

## 2024-04-06 NOTE — Telephone Encounter (Signed)
-----   Message from Jennifer Crape, MD sent at 04/06/2024 11:06 AM EST ----- Non onbstructive CAD. Ecasa and NTG. Get him in for bmp and LL. Diet and exercise. Cc pcp Jennifer JONELLE Crape, MD 04/06/2024 11:06 AM

## 2024-04-06 NOTE — Telephone Encounter (Signed)
Results reviewed with pt as per Dr. Revankar's note.  Pt verbalized understanding and had no additional questions. Routed to PCP.  

## 2024-04-09 ENCOUNTER — Ambulatory Visit: Payer: Self-pay | Admitting: Cardiology

## 2024-04-09 DIAGNOSIS — I2510 Atherosclerotic heart disease of native coronary artery without angina pectoris: Secondary | ICD-10-CM

## 2024-04-09 DIAGNOSIS — E782 Mixed hyperlipidemia: Secondary | ICD-10-CM

## 2024-04-11 NOTE — Telephone Encounter (Signed)
 Patient ready MyChart cardiac CT results on 04/10/24.  Alan, RN

## 2024-04-12 ENCOUNTER — Telehealth: Payer: Self-pay

## 2024-04-12 MED ORDER — ATORVASTATIN CALCIUM 20 MG PO TABS: 20.0000 mg | ORAL_TABLET | Freq: Every morning | ORAL | 3 refills | Status: DC

## 2024-04-12 MED ORDER — ATORVASTATIN CALCIUM 20 MG PO TABS: 20.0000 mg | ORAL_TABLET | Freq: Every morning | ORAL | 3 refills | Status: AC

## 2024-04-12 NOTE — Telephone Encounter (Signed)
 CMN PAP with supplies form has been signed by Dr. Chalice and faxed back to [facility].

## 2024-04-12 NOTE — Addendum Note (Signed)
 Addended by: ONEITA BERLINER on: 04/12/2024 11:51 AM   Modules accepted: Orders

## 2024-04-12 NOTE — Telephone Encounter (Signed)
-----   Message from Jennifer Crape, MD sent at 04/12/2024 11:32 AM EST ----- Double statin and liver lipid check in 6 weeks.  Diet and excise.  Copy primary ----- Message ----- From: Oneita Berliner, RN Sent: 04/12/2024  11:24 AM EST To: Jennifer JONELLE Crape, MD  ----- Message from Berliner Oneita, RN sent at 04/12/2024 11:24 AM EST -----

## 2024-04-12 NOTE — Telephone Encounter (Signed)
 Recent labs requested and done at PCP after cardiac CT

## 2024-04-12 NOTE — Telephone Encounter (Signed)
Orders placed and MyChart message sent.

## 2024-04-12 NOTE — Telephone Encounter (Signed)
 Cpap order faxed to [facility]

## 2024-04-12 NOTE — Addendum Note (Signed)
 Addended by: ONEITA BERLINER on: 04/12/2024 01:03 PM   Modules accepted: Orders

## 2024-04-18 ENCOUNTER — Ambulatory Visit: Attending: Cardiology

## 2024-04-18 DIAGNOSIS — R011 Cardiac murmur, unspecified: Secondary | ICD-10-CM | POA: Diagnosis not present

## 2024-04-18 LAB — ECHOCARDIOGRAM COMPLETE
Area-P 1/2: 4.08 cm2
MV M vel: 4.95 m/s
MV Peak grad: 98 mmHg
S' Lateral: 3 cm

## 2024-04-19 ENCOUNTER — Encounter: Payer: Self-pay | Admitting: Adult Health

## 2024-04-20 MED ORDER — AMPHETAMINE-DEXTROAMPHETAMINE 30 MG PO TABS: 30.0000 mg | ORAL_TABLET | Freq: Two times a day (BID) | ORAL | 0 refills | Status: AC

## 2024-04-20 NOTE — Telephone Encounter (Signed)
 Last seen 12-07-2023, next OV 11-27-2024, last fill 03-21-2024 #[id number]

## 2024-05-17 ENCOUNTER — Ambulatory Visit: Admitting: Cardiology

## 2024-11-27 ENCOUNTER — Telehealth: Admitting: Adult Health
